# Patient Record
Sex: Male | Born: 1955
Health system: Southern US, Community
[De-identification: ages and names within clinical notes are randomized; demographics above are authoritative.]

## PROBLEM LIST (undated history)

## (undated) DIAGNOSIS — D369 Benign neoplasm, unspecified site: Secondary | ICD-10-CM

## (undated) DIAGNOSIS — E119 Type 2 diabetes mellitus without complications: Secondary | ICD-10-CM

## (undated) DIAGNOSIS — K5792 Diverticulitis of intestine, part unspecified, without perforation or abscess without bleeding: Secondary | ICD-10-CM

## (undated) DIAGNOSIS — M1990 Unspecified osteoarthritis, unspecified site: Secondary | ICD-10-CM

## (undated) DIAGNOSIS — K219 Gastro-esophageal reflux disease without esophagitis: Secondary | ICD-10-CM

## (undated) DIAGNOSIS — R7989 Other specified abnormal findings of blood chemistry: Secondary | ICD-10-CM

## (undated) DIAGNOSIS — Z9989 Dependence on other enabling machines and devices: Secondary | ICD-10-CM

## (undated) DIAGNOSIS — E785 Hyperlipidemia, unspecified: Secondary | ICD-10-CM

## (undated) DIAGNOSIS — I10 Essential (primary) hypertension: Secondary | ICD-10-CM

## (undated) DIAGNOSIS — G4733 Obstructive sleep apnea (adult) (pediatric): Secondary | ICD-10-CM

## (undated) HISTORY — PX: VASECTOMY: SHX75

## (undated) HISTORY — PX: HERNIA REPAIR: SHX51

## (undated) HISTORY — PX: COLONOSCOPY: SHX174

---

## 2007-01-21 ENCOUNTER — Ambulatory Visit: Payer: Self-pay | Admitting: Internal Medicine

## 2008-04-24 ENCOUNTER — Ambulatory Visit: Payer: Self-pay | Admitting: Gastroenterology

## 2008-06-22 ENCOUNTER — Ambulatory Visit: Payer: Self-pay | Admitting: Family Medicine

## 2012-06-03 ENCOUNTER — Ambulatory Visit: Payer: Self-pay | Admitting: Otolaryngology

## 2013-12-02 HISTORY — PX: UMBILICAL HERNIA REPAIR: SUR1181

## 2014-02-06 ENCOUNTER — Other Ambulatory Visit: Payer: Self-pay | Admitting: Urology

## 2014-02-11 IMAGING — RF DG BARIUM SWALLOW
7 of 8 series · 14 of 24 positions shown · non-contrast
Comparison: none

REASON FOR EXAM: dysphagia
COMMENTS:

PROCEDURE:     FL  - FL BARIUM SWALLOW  - June 03, 2012 [DATE]
RESULT:     Comparison: None
INDICATION: Dysphagia
TECHNIQUE: Multiple single and double-contrast images of the esophagus were
obtained under fluoroscopic guidance. Total fluoroscopy time was 1.8 minutes.

[Series 1: fluoro_barium 2fps_bw · 0.17mm/px · 2 of 26 frames shown (1 of 7)]
[frame 4/26]
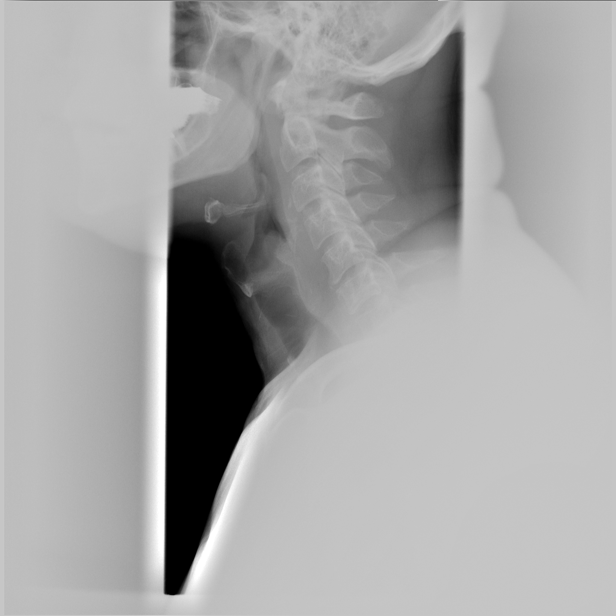
[frame 14/26]
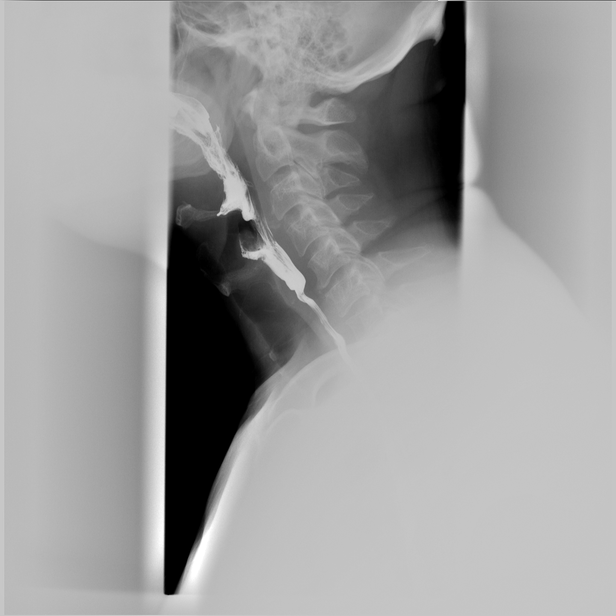

[Series 3: fluoro_barium 2fps_bw · 0.17mm/px · 3 of 30 frames shown (2 of 7)]
[frame 5/30]
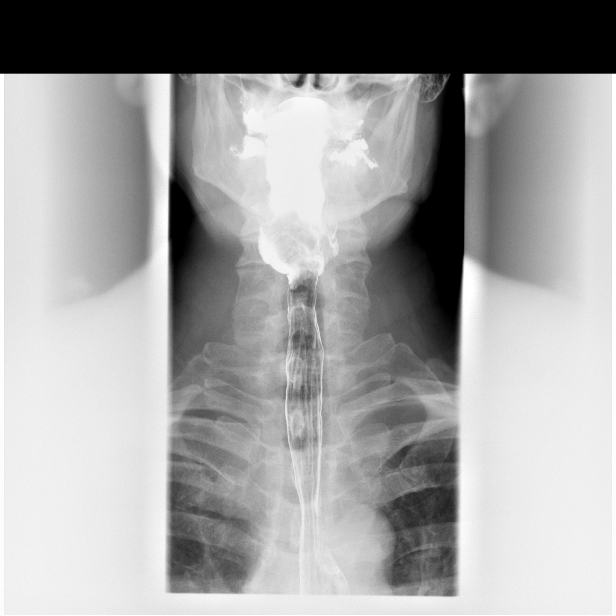
[frame 16/30]
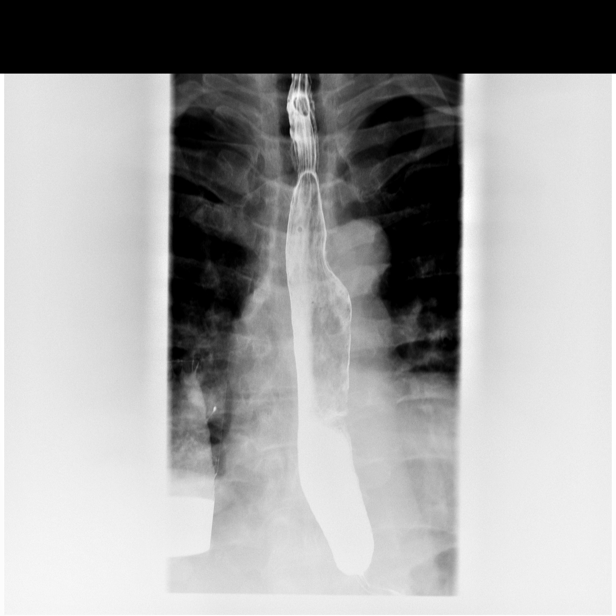
[frame 26/30]
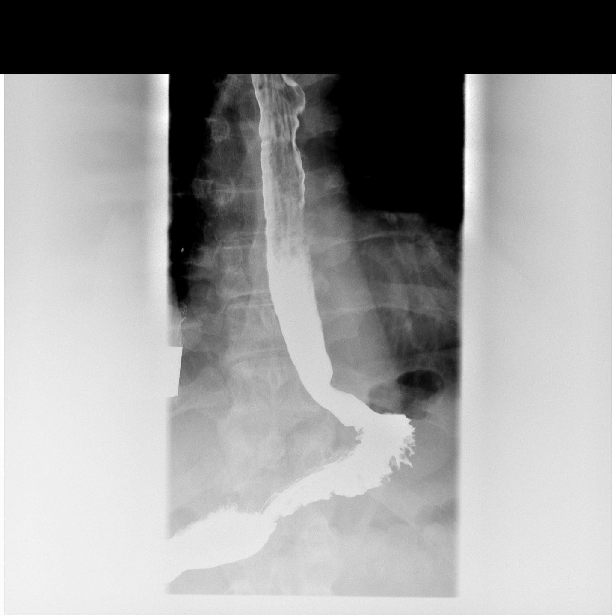

[Series 4: fluoro_barium 2fps_bw · 0.17mm/px · 1 of 27 frames shown (3 of 7)]
[frame 14/27]
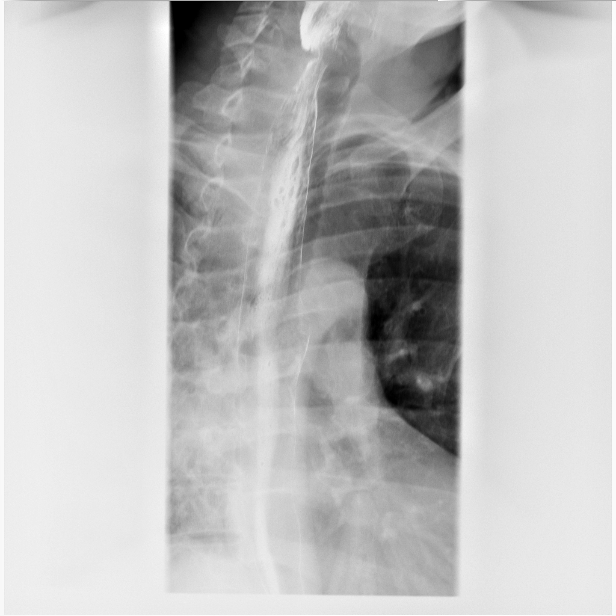

[Series 5: fluoro_barium 2fps_bw · 0.17mm/px · 3 of 40 frames shown (4 of 7)]
[frame 7/40]
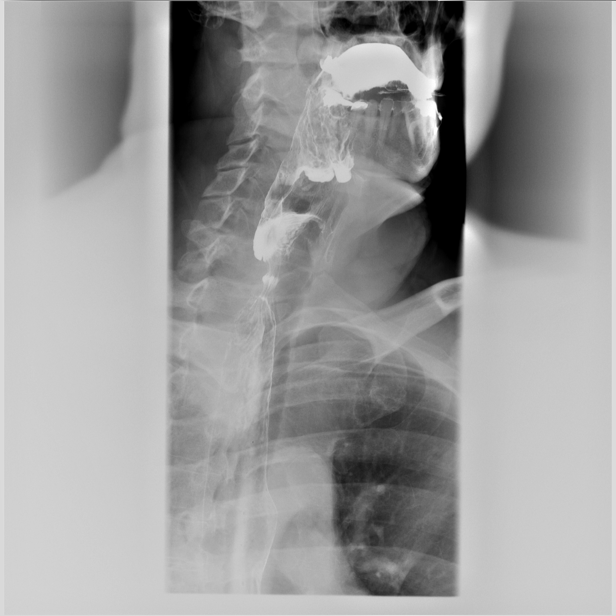
[frame 11/40]
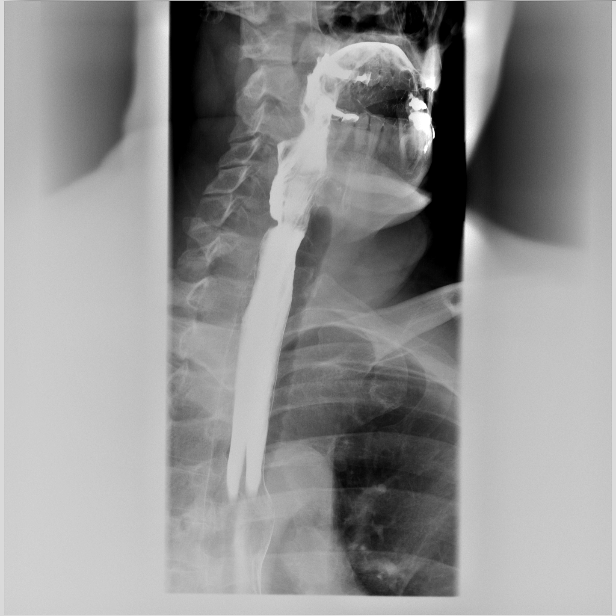
[frame 35/40]
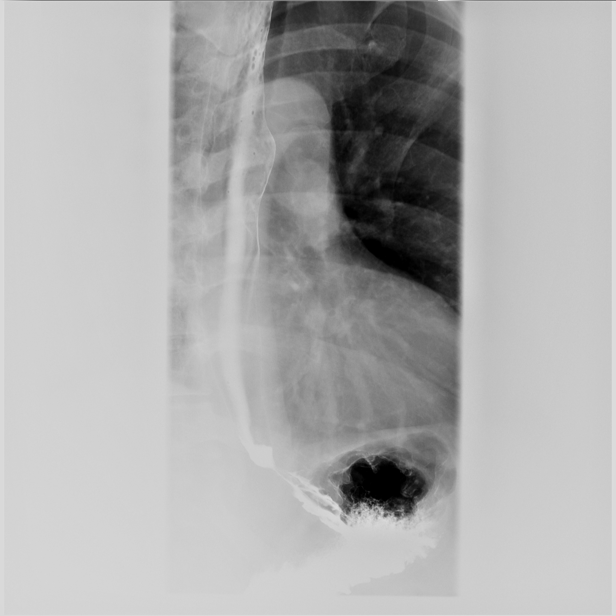

[Series 6: fluoro_barium 2fps_bw · 0.17mm/px · 1 of 40 frames shown (5 of 7)]
[frame 23/40]
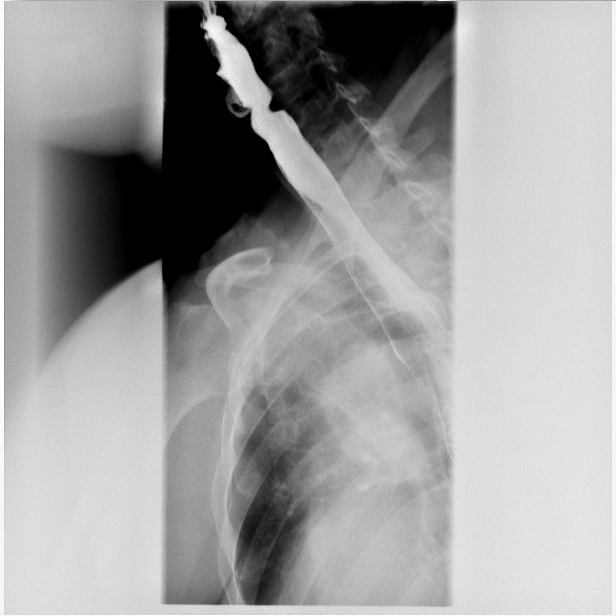

[Series 7: fluoro_barium 2fps_bw · 0.18mm/px · 2 of 40 frames shown (6 of 7)]
[frame 7/40]
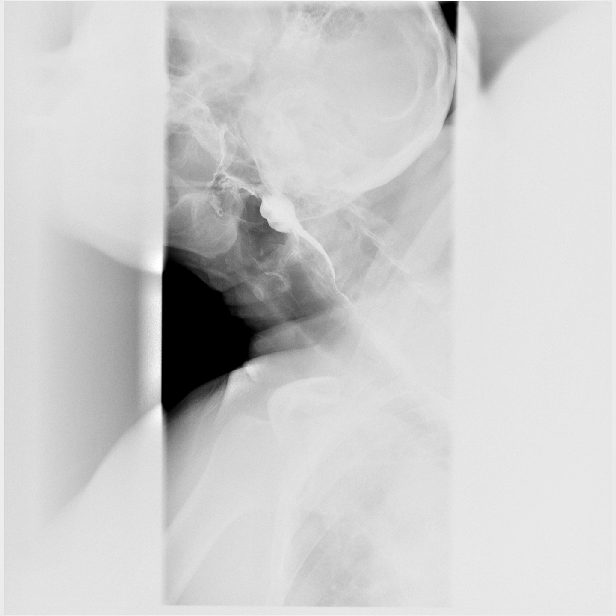
[frame 21/40]
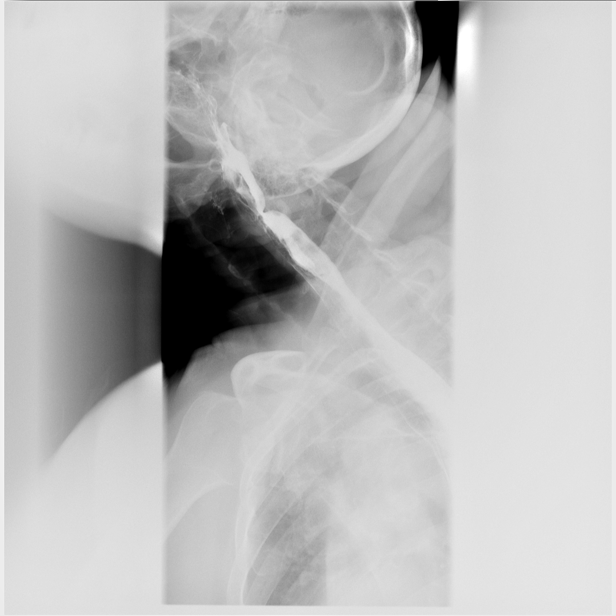

[Series 8: fluoro_barium 2fps_bw · 0.18mm/px · 2 of 18 frames shown (7 of 7)]
[frame 3/18]
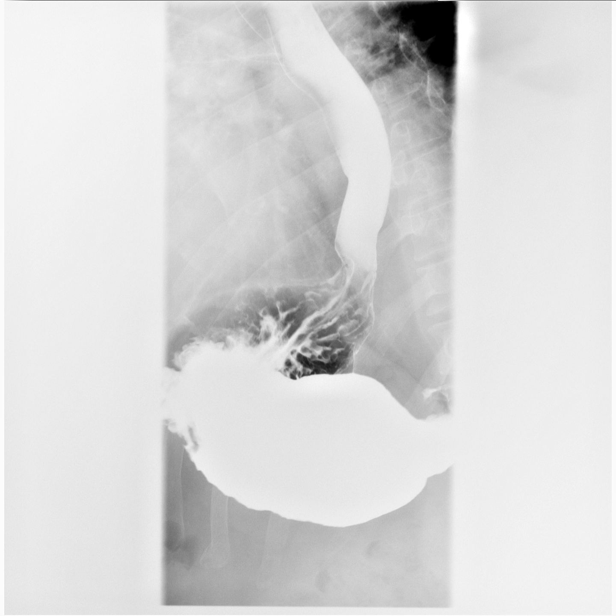
[frame 16/18]
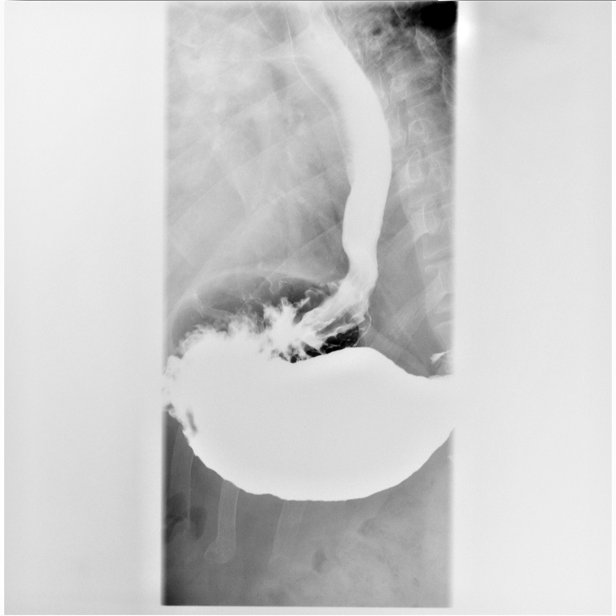

[14 of 24 positions shown; findings below may reference images not displayed]

FINDINGS: There was normal pharyngeal anatomy and motility. There is
prominent cricopharyngeus impression upon the esophagus. Contrast flowed
freely through the esophagus without evidence of stricture or mass. There
was normal esophageal mucosa without evidence of irregularity or ulceration.
 There is gastroesophageal reflux. Esophageal motility is normal. No
definite hiatal hernia was demonstrated.

At the end of the examination a 12.5mm barium tablet was administered which
transited through the esophagus and esophagogastric junction without delay.
IMPRESSION: 1. Gastroesophageal reflux. Otherwise unremarkable barium swallow.

[REDACTED]

## 2014-03-03 ENCOUNTER — Encounter (HOSPITAL_BASED_OUTPATIENT_CLINIC_OR_DEPARTMENT_OTHER): Payer: Self-pay | Admitting: *Deleted

## 2014-03-03 NOTE — Progress Notes (Signed)
NPO AFTER MN. ARRIVE AT 0715. NEEDS ISTAT AND EKG. WILL TAKE AM MEDS W/ SIPS OF WATER.

## 2014-03-09 NOTE — Anesthesia Preprocedure Evaluation (Addendum)
Anesthesia Evaluation  Patient identified by MRN, date of birth, ID band Patient awake    Reviewed: Allergy & Precautions, H&P , NPO status , Patient's Chart, lab work & pertinent test results  Airway Mallampati: II TM Distance: >3 FB Neck ROM: Full    Dental no notable dental hx.    Pulmonary neg pulmonary ROS, sleep apnea and Continuous Positive Airway Pressure Ventilation ,  breath sounds clear to auscultation  Pulmonary exam normal       Cardiovascular hypertension, negative cardio ROS  Rhythm:Regular Rate:Normal     Neuro/Psych negative neurological ROS  negative psych ROS   GI/Hepatic negative GI ROS, Neg liver ROS, GERD-  Medicated and Controlled,  Endo/Other  negative endocrine ROS  Renal/GU negative Renal ROS  negative genitourinary   Musculoskeletal negative musculoskeletal ROS (+)   Abdominal   Peds negative pediatric ROS (+)  Hematology negative hematology ROS (+)   Anesthesia Other Findings   Reproductive/Obstetrics negative OB ROS                         Anesthesia Physical Anesthesia Plan  ASA: II  Anesthesia Plan: General   Post-op Pain Management:    Induction: Intravenous  Airway Management Planned: LMA  Additional Equipment:   Intra-op Plan:   Post-operative Plan:   Informed Consent: I have reviewed the patients History and Physical, chart, labs and discussed the procedure including the risks, benefits and alternatives for the proposed anesthesia with the patient or authorized representative who has indicated his/her understanding and acceptance.   Dental advisory given  Plan Discussed with:   Anesthesia Plan Comments:       Anesthesia Quick Evaluation

## 2014-03-10 ENCOUNTER — Ambulatory Visit (HOSPITAL_BASED_OUTPATIENT_CLINIC_OR_DEPARTMENT_OTHER): Payer: Managed Care, Other (non HMO) | Admitting: Anesthesiology

## 2014-03-10 ENCOUNTER — Encounter (HOSPITAL_BASED_OUTPATIENT_CLINIC_OR_DEPARTMENT_OTHER): Payer: Self-pay | Admitting: Urology

## 2014-03-10 ENCOUNTER — Ambulatory Visit (HOSPITAL_BASED_OUTPATIENT_CLINIC_OR_DEPARTMENT_OTHER)
Admission: RE | Admit: 2014-03-10 | Discharge: 2014-03-10 | Disposition: A | Discharge status: Home / Self Care | Payer: Managed Care, Other (non HMO) | Source: Ambulatory Visit | Attending: Urology | Admitting: Urology

## 2014-03-10 ENCOUNTER — Encounter (HOSPITAL_BASED_OUTPATIENT_CLINIC_OR_DEPARTMENT_OTHER)
Admission: RE | Disposition: A | Discharge status: Home / Self Care | Payer: Managed Care, Other (non HMO) | Source: Ambulatory Visit | Attending: Urology

## 2014-03-10 ENCOUNTER — Encounter (HOSPITAL_BASED_OUTPATIENT_CLINIC_OR_DEPARTMENT_OTHER): Payer: Managed Care, Other (non HMO) | Admitting: Anesthesiology

## 2014-03-10 DIAGNOSIS — L940 Localized scleroderma [morphea]: Secondary | ICD-10-CM | POA: Diagnosis not present

## 2014-03-10 DIAGNOSIS — K219 Gastro-esophageal reflux disease without esophagitis: Secondary | ICD-10-CM | POA: Diagnosis not present

## 2014-03-10 DIAGNOSIS — G4730 Sleep apnea, unspecified: Secondary | ICD-10-CM | POA: Diagnosis not present

## 2014-03-10 DIAGNOSIS — I10 Essential (primary) hypertension: Secondary | ICD-10-CM | POA: Diagnosis not present

## 2014-03-10 DIAGNOSIS — N471 Phimosis: Secondary | ICD-10-CM | POA: Insufficient documentation

## 2014-03-10 DIAGNOSIS — N478 Other disorders of prepuce: Principal | ICD-10-CM | POA: Insufficient documentation

## 2014-03-10 HISTORY — DX: Obstructive sleep apnea (adult) (pediatric): G47.33

## 2014-03-10 HISTORY — PX: CIRCUMCISION: SHX1350

## 2014-03-10 HISTORY — DX: Essential (primary) hypertension: I10

## 2014-03-10 HISTORY — DX: Gastro-esophageal reflux disease without esophagitis: K21.9

## 2014-03-10 HISTORY — DX: Dependence on other enabling machines and devices: Z99.89

## 2014-03-10 LAB — POCT I-STAT 4, (NA,K, GLUC, HGB,HCT)
Glucose, Bld: 120 mg/dL — ABNORMAL HIGH (ref 70–99)
Glucose, Bld: 120 mg/dL — ABNORMAL HIGH (ref 70–99)
HCT: 43 % (ref 39.0–52.0)
HCT: 44 % (ref 39.0–52.0)
HEMOGLOBIN: 14.6 g/dL (ref 13.0–17.0)
Hemoglobin: 15 g/dL (ref 13.0–17.0)
Potassium: 5.6 mEq/L — ABNORMAL HIGH (ref 3.7–5.3)
Potassium: 6.1 mEq/L — ABNORMAL HIGH (ref 3.7–5.3)
SODIUM: 139 meq/L (ref 137–147)
Sodium: 138 mEq/L (ref 137–147)

## 2014-03-10 SURGERY — CIRCUMCISION, ADULT
Anesthesia: General | Site: Penis

## 2014-03-10 MED ORDER — CEFAZOLIN SODIUM-DEXTROSE 2-3 GM-% IV SOLR
INTRAVENOUS | Status: AC
  Filled 2014-03-10: qty 50

## 2014-03-10 MED ORDER — OXYCODONE HCL 5 MG PO TABS
5.0000 mg | ORAL_TABLET | ORAL | Status: DC | PRN
  Administered 2014-03-10: 5 mg via ORAL
  Filled 2014-03-10: qty 1

## 2014-03-10 MED ORDER — PROMETHAZINE HCL 25 MG/ML IJ SOLN
6.2500 mg | INTRAMUSCULAR | Status: DC | PRN
  Filled 2014-03-10: qty 1

## 2014-03-10 MED ORDER — FENTANYL CITRATE 0.05 MG/ML IJ SOLN
INTRAMUSCULAR | Status: AC
  Filled 2014-03-10: qty 4

## 2014-03-10 MED ORDER — ACETAMINOPHEN 10 MG/ML IV SOLN
INTRAVENOUS | Status: DC | PRN
  Administered 2014-03-10: 1000 mg via INTRAVENOUS

## 2014-03-10 MED ORDER — MEPERIDINE HCL 25 MG/ML IJ SOLN
6.2500 mg | INTRAMUSCULAR | Status: DC | PRN
  Filled 2014-03-10: qty 1

## 2014-03-10 MED ORDER — LACTATED RINGERS IV SOLN
INTRAVENOUS | Status: DC
  Administered 2014-03-10: 08:00:00 via INTRAVENOUS
  Filled 2014-03-10: qty 1000

## 2014-03-10 MED ORDER — PROPOFOL 10 MG/ML IV BOLUS
INTRAVENOUS | Status: DC | PRN
  Administered 2014-03-10: 200 mg via INTRAVENOUS

## 2014-03-10 MED ORDER — DEXAMETHASONE SODIUM PHOSPHATE 4 MG/ML IJ SOLN
INTRAMUSCULAR | Status: DC | PRN
  Administered 2014-03-10: 10 mg via INTRAVENOUS

## 2014-03-10 MED ORDER — OXYCODONE HCL 5 MG PO TABS
ORAL_TABLET | ORAL | Status: AC
  Filled 2014-03-10: qty 1

## 2014-03-10 MED ORDER — KETOROLAC TROMETHAMINE 30 MG/ML IJ SOLN
INTRAMUSCULAR | Status: DC | PRN
  Administered 2014-03-10: 30 mg via INTRAVENOUS

## 2014-03-10 MED ORDER — FENTANYL CITRATE 0.05 MG/ML IJ SOLN
25.0000 ug | INTRAMUSCULAR | Status: DC | PRN
  Filled 2014-03-10: qty 1

## 2014-03-10 MED ORDER — CEFAZOLIN SODIUM-DEXTROSE 2-3 GM-% IV SOLR
2.0000 g | INTRAVENOUS | Status: AC
  Administered 2014-03-10: 2 g via INTRAVENOUS
  Filled 2014-03-10: qty 50

## 2014-03-10 MED ORDER — LIDOCAINE HCL (CARDIAC) 20 MG/ML IV SOLN
INTRAVENOUS | Status: DC | PRN
  Administered 2014-03-10: 100 mg via INTRAVENOUS

## 2014-03-10 MED ORDER — SODIUM CHLORIDE 0.9 % IR SOLN
Status: DC | PRN
  Administered 2014-03-10: 500 mL

## 2014-03-10 MED ORDER — OXYCODONE HCL 5 MG PO TABS: 5.0000 mg | ORAL_TABLET | ORAL | Status: DC | PRN

## 2014-03-10 MED ORDER — MIDAZOLAM HCL 5 MG/5ML IJ SOLN
INTRAMUSCULAR | Status: DC | PRN
  Administered 2014-03-10: 2 mg via INTRAVENOUS

## 2014-03-10 MED ORDER — MIDAZOLAM HCL 2 MG/2ML IJ SOLN
INTRAMUSCULAR | Status: AC
  Filled 2014-03-10: qty 2

## 2014-03-10 MED ORDER — EPHEDRINE SULFATE 50 MG/ML IJ SOLN
INTRAMUSCULAR | Status: DC | PRN
  Administered 2014-03-10 (×3): 10 mg via INTRAVENOUS

## 2014-03-10 MED ORDER — CEFAZOLIN SODIUM 1-5 GM-% IV SOLN
1.0000 g | INTRAVENOUS | Status: DC
  Filled 2014-03-10: qty 50

## 2014-03-10 MED ORDER — DOCUSATE SODIUM 100 MG PO CAPS: 100.0000 mg | ORAL_CAPSULE | Freq: Two times a day (BID) | ORAL | Status: DC | PRN

## 2014-03-10 MED ORDER — ONDANSETRON HCL 4 MG/2ML IJ SOLN
INTRAMUSCULAR | Status: DC | PRN
  Administered 2014-03-10: 4 mg via INTRAVENOUS

## 2014-03-10 MED ORDER — FENTANYL CITRATE 0.05 MG/ML IJ SOLN
INTRAMUSCULAR | Status: DC | PRN
  Administered 2014-03-10 (×2): 50 ug via INTRAVENOUS

## 2014-03-10 SURGICAL SUPPLY — 37 items
BANDAGE CO FLEX L/F 2IN X 5YD (GAUZE/BANDAGES/DRESSINGS) ×2 IMPLANT
BANDAGE COBAN STERILE 2 (GAUZE/BANDAGES/DRESSINGS) ×2 IMPLANT
BLADE SURG 15 STRL LF DISP TIS (BLADE) ×1 IMPLANT
BLADE SURG 15 STRL SS (BLADE) ×1
BNDG CONFORM 2 STRL LF (GAUZE/BANDAGES/DRESSINGS) ×2 IMPLANT
CLOTH BEACON ORANGE TIMEOUT ST (SAFETY) ×2 IMPLANT
COVER MAYO STAND STRL (DRAPES) ×2 IMPLANT
COVER TABLE BACK 60X90 (DRAPES) ×2 IMPLANT
DECANTER SPIKE VIAL GLASS SM (MISCELLANEOUS) IMPLANT
DRAPE LAPAROTOMY T 102X78X121 (DRAPES) IMPLANT
DRAPE PED LAPAROTOMY (DRAPES) ×2 IMPLANT
ELECT NEEDLE TIP 2.8 STRL (NEEDLE) ×2 IMPLANT
ELECT REM PT RETURN 9FT ADLT (ELECTROSURGICAL) ×2
ELECTRODE REM PT RTRN 9FT ADLT (ELECTROSURGICAL) ×1 IMPLANT
GAUZE SPONGE 4X4 16PLY XRAY LF (GAUZE/BANDAGES/DRESSINGS) ×2 IMPLANT
GAUZE XEROFORM 1X8 LF (GAUZE/BANDAGES/DRESSINGS) ×2 IMPLANT
GLOVE BIO SURGEON STRL SZ7.5 (GLOVE) ×4 IMPLANT
GOWN STRL REIN XL XLG (GOWN DISPOSABLE) IMPLANT
GOWN STRL REUS W/ TWL LRG LVL3 (GOWN DISPOSABLE) ×2 IMPLANT
GOWN STRL REUS W/ TWL XL LVL3 (GOWN DISPOSABLE) ×1 IMPLANT
GOWN STRL REUS W/TWL LRG LVL3 (GOWN DISPOSABLE) ×2
GOWN STRL REUS W/TWL XL LVL3 (GOWN DISPOSABLE) ×1
NEEDLE HYPO 25X1 1.5 SAFETY (NEEDLE) ×2 IMPLANT
NS IRRIG 500ML POUR BTL (IV SOLUTION) ×2 IMPLANT
PACK BASIN DAY SURGERY FS (CUSTOM PROCEDURE TRAY) ×2 IMPLANT
PENCIL BUTTON HOLSTER BLD 10FT (ELECTRODE) ×2 IMPLANT
SPONGE GAUZE 4X4 12PLY STER LF (GAUZE/BANDAGES/DRESSINGS) ×2 IMPLANT
SUT VIC AB 3-0 SH 27 (SUTURE) ×2
SUT VIC AB 3-0 SH 27X BRD (SUTURE) ×2 IMPLANT
SUT VIC AB 4-0 RB1 27 (SUTURE) ×2
SUT VIC AB 4-0 RB1 27X BRD (SUTURE) ×2 IMPLANT
SUT VIC AB 4-0 SH 27 (SUTURE)
SUT VIC AB 4-0 SH 27XBRD (SUTURE) IMPLANT
SYR CONTROL 10ML LL (SYRINGE) ×2 IMPLANT
TOWEL OR 17X26 10 PK STRL BLUE (TOWEL DISPOSABLE) ×2 IMPLANT
TRAY DSU PREP LF (CUSTOM PROCEDURE TRAY) ×2 IMPLANT
WATER STERILE IRR 500ML POUR (IV SOLUTION) IMPLANT

## 2014-03-10 NOTE — Interval H&P Note (Signed)
History and Physical Interval Note:  03/10/2014 8:45 AM  Micheal Johnson.  has presented today for surgery, with the diagnosis of phimosis  The various methods of treatment have been discussed with the patient and family. After consideration of risks, benefits and other options for treatment, the patient has consented to  Procedure(s): CIRCUMCISION ADULT (N/A) as a surgical intervention .  The patient's history has been reviewed, patient examined, no change in status, stable for surgery.  I have reviewed the patient's chart and labs.  Questions were answered to the patient's satisfaction.     Louis Meckel W

## 2014-03-10 NOTE — Discharge Instructions (Signed)
Postoperative instructions for circumcision  Wound:  In most cases your incision will have absorbable sutures that run along the course of your incision and will dissolve within the first 10-20 days. Some will fall out even earlier. Expect some redness as the sutures dissolved but this should occur only around the sutures. If there is generalized redness, especially with increasing pain or swelling, let us know. The penis will very likely get "black and blue" as the blood in the tissues spread. Sometimes the whole penis will turn colors. The black and blue is followed by a yellow and brown color. In time, all the discoloration will go away.  Diet:  You may return to your normal diet within 24 hours following your surgery. You may note some mild nausea and possibly vomiting the first 6-8 hours following surgery. This is usually due to the side effects of anesthesia, and will disappear quite soon. I would suggest clear liquids and a very light meal the first evening following your surgery.  Activity:  Your physical activity should be restricted the first 48 hours. During that time you should remain relatively inactive, moving about only when necessary. During the first 7 days following surgery he should avoid lifting any heavy objects (anything greater than 15 pounds), and avoid strenuous exercise. If you work, ask [place] specifically about your restrictions, both for work and home. We will write a note to your employer if needed.  Ice packs can be placed on and off over the penis for the first 48 hours to help relieve the pain and keep the swelling down. Frozen peas or corn in a ZipLock bag can be frozen, used and re-frozen. Fifteen minutes on and 15 minutes off is a reasonable schedule.   Hygiene:  You may shower 48 hours after your surgery. Tub bathing should be restricted until the seventh day.  Medication:  You will be sent home with some type of pain medication. In many cases you will be sent  home with a narcotic pain pill (oxycodone). If the pain is not too bad, you may take either Tylenol (acetaminophen) or Advil (ibuprofen) which contain no narcotic agents, and might be tolerated a little better, with fewer side effects. If the pain medication you are sent home with does not control the pain, you will have to let us know. Some narcotic pain medications cannot be given or refilled by a phone call to a pharmacy.  Problems you should report to [place]:   Fever of 101.0 degrees Fahrenheit or greater.  Moderate or severe swelling under the skin incision or involving the scrotum.  Drug reaction such as hives, a rash, nausea or vomiting.    Post Anesthesia Home Care Instructions  Activity: Get plenty of rest for the remainder of the day. A responsible adult should stay with you for 24 hours following the procedure.  For the next 24 hours, DO NOT: -Drive a car -Paediatric nurse -Drink alcoholic beverages -Take any medication unless instructed by your physician -Make any legal decisions or sign important papers.  Meals: Start with liquid foods such as gelatin or soup. Progress to regular foods as tolerated. Avoid greasy, spicy, heavy foods. If nausea and/or vomiting occur, drink only clear liquids until the nausea and/or vomiting subsides. Call your physician if vomiting continues.  Special Instructions/Symptoms: Your throat may feel dry or sore from the anesthesia or the breathing tube placed in your throat during surgery. If this causes discomfort, gargle with warm salt water. The discomfort should disappear  within 24 hours.

## 2014-03-10 NOTE — Anesthesia Postprocedure Evaluation (Signed)
  Anesthesia Post-op Note  Patient: Micheal Johnson.  Procedure(s) Performed: Procedure(s) (LRB): CIRCUMCISION ADULT (N/A)  Patient Location: PACU  Anesthesia Type: General  Level of Consciousness: awake and alert   Airway and Oxygen Therapy: Patient Spontanous Breathing  Post-op Pain: mild  Post-op Assessment: Post-op Vital signs reviewed, Patient's Cardiovascular Status Stable, Respiratory Function Stable, Patent Airway and No signs of Nausea or vomiting  Last Vitals:  Filed Vitals:   03/10/14 1000  BP: 133/87  Pulse: 102  Temp:   Resp: 12    Post-op Vital Signs: stable   Complications: No apparent anesthesia complications

## 2014-03-10 NOTE — Progress Notes (Signed)
Dressing around penis came off.  Patient had been told that he would need to keep it on for several days.  Dr.Herrick was called to check surgery site.  He redressed the area with bacitracin, gauze and coban. Patient was informed that if it falls off again just leave it off.

## 2014-03-10 NOTE — H&P (Signed)
Reason For Visit Phimosis   History of Present Illness 58 year old male referred by Dr. Emily Filbert for a tight band around his foreskin.  The patient's symptoms began approximately 3 weeks ago. He noted a tight and around his foreskin. The patient has been able to retract his foreskin without difficulty, although it is tight. He does not prohibit him from having intercourse. The patient also has noted some itching and burning in the area of the band. He has tried over-the-counter steroid cream which seems to have improved it somewhat. The patient denies a history of recurrent urinary tract infections. He was circumcised as a child although has had redundant foreskin. He retract his foreskin daily. He denies any voiding symptoms. He denies any skin lesions or ulcerations.  12/23/13: Treated wit h 6 wks of steroid cream. The patient has been diligent about applying cream to the affected area at least twice a day. He relates that the [place] has loosened slightly but the discoloration or likened sclerosis area seems to be widening. The patient denies any trouble with his erections.     Interval: The patient has now been on steroid cream for 12 weeks and has seen little benefit. He continues to have a tight band of phimotic foreskin which creates pain with erections. He denies any voiding symptoms. He denies any ulcerations or penile lesions.   Past Medical History Problems  1. History of arthritis (V13.4) 2. History of esophageal reflux (V12.79) 3. History of hypertension (V12.59) 4. History of sleep apnea (V13.89)  Surgical History Problems  1. History of Surgery Of Male Genitalia Vasectomy  Current Meds 1. Clotrimazole-Betamethasone 1-0.05 % External Cream; APPLY THIN FILM TO  AFFECTED AREA(S) 3 TIMES DAILY;  Therapy: [id number] to (Last IO:[id number]) Ordered 2. Juice Plus Fibre Oral Liquid;  Therapy: (Recorded:08May2015) to Recorded 3. Losartan Potassium 100 MG Oral Tablet;  Therapy: 14Apr2015 to Recorded 4. Omeprazole 20 MG Oral Capsule Delayed Release;  Therapy: [id number] to Recorded  Allergies Medication  1. No Known Drug Allergies  Family History Problems  1. No pertinent family history : Mother  Social History Problems  1. Alcohol use (V49.89) 2. Caffeine use (V49.89) 3. Married 4. Never a smoker 5. 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 Three children  Review of Systems No changes in pts bowel habits, neurological changes, or progressive lower urinary tract symptoms.    Vitals Vital Signs [Data Includes: Last 1 Day]  Recorded: 04Aug2015 03:31PM  Blood Pressure: 151 / 99 Temperature: 97.6 F Heart Rate: 74  Physical Exam Genitourinary: The patient has lichen sclerosus circumferentially on his foreskin and the corona of his glans. He is able to retract his foreskin quite easily. There are no ulcerations or lesions.    Results/Data Urine [Data Includes: Last 1 Day]   [id number]  COLOR YELLOW   APPEARANCE CLEAR   SPECIFIC GRAVITY 1.015   pH 7.0   GLUCOSE NEG mg/dL  BILIRUBIN NEG   KETONE NEG mg/dL  BLOOD NEG   PROTEIN NEG mg/dL  UROBILINOGEN 1 mg/dL  NITRITE NEG   LEUKOCYTE ESTERASE NEG    Patient's urinalysis today reveals no evidence of infection, inflammation, or hematuria.   Assessment Assessed  1. Lichen sclerosus (119.4) 2. Phimosis (605)  Plan Health Maintenance  1. UA With REFLEX; [Do Not Release]; Status:Complete;   Done: [id number] 03:21PM Phimosis  2. Follow-up Schedule Surgery Office  Follow-up  Status: Complete  Done: [id number]  Discussion/Summary The patient has effectively failed conservative/medical therapy. As such, I recommended  the patient undergo a circumcision. We discussed the surgery in detail as well as the recovery. I detailed the risks and benefits of the operation. I answered all the patient's questions in regards to the operation. Ultimately, the patient would like to proceed. We'll get this scheduled  at the patient's convenience.

## 2014-03-10 NOTE — Anesthesia Procedure Notes (Signed)
Procedure Name: LMA Insertion Date/Time: 03/10/2014 8:55 AM Performed by: Mechele Claude Pre-anesthesia Checklist: Patient identified, Emergency Drugs available, Suction available and Patient being monitored Patient Re-evaluated:Patient Re-evaluated prior to inductionOxygen Delivery Method: Circle System Utilized Preoxygenation: Pre-oxygenation with 100% oxygen Intubation Type: IV induction Ventilation: Mask ventilation without difficulty LMA: LMA inserted LMA Size: 4.0 Number of attempts: 1 Airway Equipment and Method: bite block Placement Confirmation: positive ETCO2 Tube secured with: Tape Dental Injury: Teeth and Oropharynx as per pre-operative assessment

## 2014-03-10 NOTE — Op Note (Signed)
Preoperative diagnosis:  1. phimosis   Postoperative diagnosis:  1. same   Procedure: 1. cirucmcision  Surgeon: Ardis Hughs, MD  Anesthesia: General  Complications: None  Intraoperative findings: small scrotal web at the end, penile skin was removed in order to get phimotic ring and involved lichen sclerosis.  EBL: Minimal  Specimens: foreskin  Indication: Micheal Johnson. is a 58 y.o. patient with phimosis secondary to lichen sclerosis who has failed 12 weeks of medical therapy.  After reviewing the management options for treatment, he elected to proceed with the above surgical procedure(s). We have discussed the potential benefits and risks of the procedure, side effects of the proposed treatment, the likelihood of the patient achieving the goals of the procedure, and any potential problems that might occur during the procedure or recuperation. Informed consent has been obtained.  Description of procedure:  After general anesthesia was induced the patient was prepped and draped in the routine sterile fashion. A penile block was then performed with local anesthetic. The foreskin was then opened enough to allow retraction over the glans penis. The prepuce was then marked so as to provide a proximately 5 mm collar.  The foreskin was then reduced over the glans penis and marked so as to allow a nice fit for the reanastomosis of the skin to the collar.  A 15 blade was then used to make a circumferential incision through the dermis of both marks. A pair of Metzenbaum scissors was then tunneled through the incisions and the foreskin opened on top of the scissors using electrocautery. The foreskin was then removed circumferentially. The underlying tissue was then cauterized and all bleeding stopped.  The penile skin was then approximated to the collar in 4 quadrants using a 3-0 Vicryl. The frenulum was attached with a U stitch in the dorsal surface. The remaining skin was then closed  in a running baseball stitch in 4 separate 4-0 vicryl sutures.  Vaseline impregnated dressing was then applied to the suture line in the penis was wrapped with 2 inch cling dressing gently. The patient was then given a fluff dressing and a pair of mesh underpants. The patient was subsequently awoken and returned to PACU in excellent condition. There no immediate complications.  Ardis Hughs, M.D.

## 2014-03-10 NOTE — Transfer of Care (Signed)
Immediate Anesthesia Transfer of Care Note  Patient: Micheal Johnson.  Procedure(s) Performed: Procedure(s) (LRB): CIRCUMCISION ADULT (N/A)  Patient Location: PACU  Anesthesia Type: General  Level of Consciousness: awake, alert  and oriented  Airway & Oxygen Therapy: Patient Spontanous Breathing and Patient connected to nasal cannula oxygen  Post-op Assessment: Report given to PACU RN and Post -op Vital signs reviewed and stable  Post vital signs: Reviewed and stable  Complications: No apparent anesthesia complications

## 2014-03-13 ENCOUNTER — Encounter (HOSPITAL_BASED_OUTPATIENT_CLINIC_OR_DEPARTMENT_OTHER): Payer: Self-pay | Admitting: Urology

## 2014-08-14 ENCOUNTER — Ambulatory Visit: Payer: Self-pay | Admitting: Registered Nurse

## 2016-09-18 DIAGNOSIS — H02834 Dermatochalasis of left upper eyelid: Secondary | ICD-10-CM | POA: Diagnosis not present

## 2016-09-18 DIAGNOSIS — H02831 Dermatochalasis of right upper eyelid: Secondary | ICD-10-CM | POA: Diagnosis not present

## 2016-10-24 NOTE — Discharge Instructions (Signed)
INSTRUCTIONS FOLLOWING OCULOPLASTIC SURGERY °AMY M. FOWLER, MD ° °AFTER YOUR EYE SURGERY, THER ARE MANY THINGS THWIHC YOU, THE PATIENT, CAN DO TO ASSURE THE BEST POSSIBLE RESULT FROM YOUR OPERATION.  THIS SHEET SHOULD BE REFERRED TO WHENEVER QUESTIONS ARISE.  IF THERE ARE ANY QUESTIONS NOT ANSWERED HERE, DO NOT HESITATE TO CALL OUR OFFICE AT [phone] OR [phone].  THERE IS ALWAYS [facility] AVAILABLE TO CALL IF QUESTIONS OR PROBLEMS ARISE. ° °VISION: Your vision may be blurred and out of focus after surgery until you are able to stop using your ointment, swelling resolves and your eye(s) heal. This may take 1 to 2 weeks at the least.  If your vision becomes gradually more dim or dark, this is not normal and you need to call our office immediately. ° °EYE CARE: For the first 48 hours after surgery, use ice packs frequently - “20 minutes on, 20 minutes off” - to help reduce swelling and bruising.  Small bags of frozen peas or corn make good ice packs along with cloths soaked in ice water.  If you are wearing a patch or other type of dressing following surgery, keep this on for the amount of time specified by your doctor.  For the first week following surgery, you will need to treat your stitches with great care.  If is OK to shower, but take care to not allow soapy water to run into your eye(s) to help reduce changes of infection.  You may gently clean the eyelashes and around the eye(s) with cotton balls and sterile water, BUT DO NOT RUB THE STITCHES VIGOROUSLY.  Keeping your stitches moist with ointment will help promote healing with minimal scar formation. ° °ACTIVITY: When you leave the surgery center, you should go home, rest and be inactive.  The eye(s) may feel scratchy and keeping the eyes closed will allow for faster healing.  The first week following surgery, avoid straining (anything making the face turn red) or lifting over 20 pounds.  Additionally, avoid bending which causes your head to go below  your waist.  Using your eyes will NOT harm them, so feel free to read, watch television, use the computer, etc as desired.  Driving depends on each individual, so check with your doctor if you have questions about driving. ° °MEDICATIONS:  You will be given a prescription for an ointment to use 4 times a day on your stitches.  You can use the ointment in your eyes if they feel scratchy or irritated.  If you eyelid(s) don’t close completely when you sleep, put some ointment in your eyes before bedtime. ° °EMERGENCY: If you experience SEVERE EYE PAIN OR HEADACHE UNRELIEVED BY TYLENOL OR PERCOCET, NAUSEA OR VOMITING, WORSENING REDNESS, OR WORSENING VISION (ESPECIALLY VISION THAT WA INITIALLY BETTER) CALL [phone] OR [phone] DURING BUSINESS HOURS OR AFTER HOURS. ° °General Anesthesia, Adult, Care After °These instructions provide you with information about caring for yourself after your procedure. Your health care provider may also give you more specific instructions. Your treatment has been planned according to current medical practices, but problems sometimes occur. Call your health care provider if you have any problems or questions after your procedure. °What can I expect after the procedure? °After the procedure, it is common to have: °· Vomiting. °· A sore throat. °· Mental slowness. °It is common to feel: °· Nauseous. °· Cold or shivery. °· Sleepy. °· Tired. °· Sore or achy, even in parts of your body where you did not have surgery. °Follow   these instructions at home: °For at least 24 hours after the procedure:  °· Do not: °¨ Participate in activities where you could fall or become injured. °¨ Drive. °¨ Use heavy machinery. °¨ Drink alcohol. °¨ Take sleeping pills or medicines that cause drowsiness. °¨ Make important decisions or sign legal documents. °¨ Take care of children on your own. °· Rest. °Eating and drinking  °· If you vomit, drink water, juice, or soup when you can drink without  vomiting. °· Drink enough fluid to keep your urine clear or pale yellow. °· Make sure you have little or no nausea before eating solid foods. °· Follow the diet recommended by your health care provider. °General instructions  °· Have a responsible adult stay with you until you are awake and alert. °· Return to your normal activities as told by your health care provider. Ask your health care provider what activities are safe for you. °· Take over-the-counter and prescription medicines only as told by your health care provider. °· If you smoke, do not smoke without supervision. °· Keep all follow-up visits as told by your health care provider. This is important. °Contact a health care provider if: °· You continue to have nausea or vomiting at home, and medicines are not helpful. °· You cannot drink fluids or start eating again. °· You cannot urinate after 8-12 hours. °· You develop a skin rash. °· You have fever. °· You have increasing redness at the site of your procedure. °Get help right away if: °· You have difficulty breathing. °· You have chest pain. °· You have unexpected bleeding. °· You feel that you are having a life-threatening or urgent problem. °This information is not intended to replace advice given to you by your health care provider. Make sure you discuss any questions you have with your health care provider. °Document Released: 09/22/2000 Document Revised: 11/19/2015 Document Reviewed: 05/31/2015 °[employer]vier Interactive Patient Education © 2017 [employer] ° °

## 2016-10-28 ENCOUNTER — Encounter
Admission: RE | Disposition: A | Discharge status: Home / Self Care | Payer: Self-pay | Source: Ambulatory Visit | Attending: Ophthalmology

## 2016-10-28 ENCOUNTER — Ambulatory Visit: Payer: BLUE CROSS/BLUE SHIELD | Admitting: Anesthesiology

## 2016-10-28 ENCOUNTER — Ambulatory Visit
Admission: RE | Admit: 2016-10-28 | Discharge: 2016-10-28 | Disposition: A | Discharge status: Home / Self Care | Payer: BLUE CROSS/BLUE SHIELD | Source: Ambulatory Visit | Attending: Ophthalmology | Admitting: Ophthalmology

## 2016-10-28 DIAGNOSIS — Z79899 Other long term (current) drug therapy: Secondary | ICD-10-CM | POA: Insufficient documentation

## 2016-10-28 DIAGNOSIS — H02834 Dermatochalasis of left upper eyelid: Secondary | ICD-10-CM | POA: Diagnosis not present

## 2016-10-28 DIAGNOSIS — Z9989 Dependence on other enabling machines and devices: Secondary | ICD-10-CM | POA: Insufficient documentation

## 2016-10-28 DIAGNOSIS — H02839 Dermatochalasis of unspecified eye, unspecified eyelid: Secondary | ICD-10-CM | POA: Diagnosis present

## 2016-10-28 DIAGNOSIS — I10 Essential (primary) hypertension: Secondary | ICD-10-CM | POA: Diagnosis not present

## 2016-10-28 DIAGNOSIS — H02831 Dermatochalasis of right upper eyelid: Secondary | ICD-10-CM | POA: Diagnosis not present

## 2016-10-28 DIAGNOSIS — K219 Gastro-esophageal reflux disease without esophagitis: Secondary | ICD-10-CM | POA: Diagnosis not present

## 2016-10-28 DIAGNOSIS — G4730 Sleep apnea, unspecified: Secondary | ICD-10-CM | POA: Diagnosis not present

## 2016-10-28 HISTORY — DX: Unspecified osteoarthritis, unspecified site: M19.90

## 2016-10-28 HISTORY — PX: BROW LIFT: SHX178

## 2016-10-28 HISTORY — DX: Diverticulitis of intestine, part unspecified, without perforation or abscess without bleeding: K57.92

## 2016-10-28 SURGERY — BLEPHAROPLASTY
Anesthesia: Monitor Anesthesia Care | Site: Eye | Laterality: Bilateral | Wound class: Clean

## 2016-10-28 MED ORDER — OXYCODONE HCL 5 MG PO TABS: 5.0000 mg | ORAL_TABLET | Freq: Once | ORAL | Status: DC | PRN

## 2016-10-28 MED ORDER — BSS IO SOLN
INTRAOCULAR | Status: DC | PRN
  Administered 2016-10-28: 15 mL

## 2016-10-28 MED ORDER — ERYTHROMYCIN 5 MG/GM OP OINT
TOPICAL_OINTMENT | OPHTHALMIC | Status: DC | PRN
  Administered 2016-10-28: 1 via OPHTHALMIC

## 2016-10-28 MED ORDER — OXYCODONE HCL 5 MG/5ML PO SOLN: 5.0000 mg | Freq: Once | ORAL | Status: DC | PRN

## 2016-10-28 MED ORDER — OXYCODONE-ACETAMINOPHEN 5-325 MG PO TABS: 1.0000 | ORAL_TABLET | ORAL | 0 refills | Status: DC | PRN

## 2016-10-28 MED ORDER — ERYTHROMYCIN 5 MG/GM OP OINT: TOPICAL_OINTMENT | OPHTHALMIC | 3 refills | Status: DC

## 2016-10-28 MED ORDER — TETRACAINE HCL 0.5 % OP SOLN
OPHTHALMIC | Status: DC | PRN
  Administered 2016-10-28: 2 [drp] via OPHTHALMIC

## 2016-10-28 MED ORDER — PROPOFOL 500 MG/50ML IV EMUL
INTRAVENOUS | Status: DC | PRN
  Administered 2016-10-28: 75 ug/kg/min via INTRAVENOUS

## 2016-10-28 MED ORDER — LACTATED RINGERS IV SOLN
INTRAVENOUS | Status: DC
  Administered 2016-10-28: 09:00:00 via INTRAVENOUS

## 2016-10-28 MED ORDER — ACETAMINOPHEN 160 MG/5ML PO SOLN: 325.0000 mg | ORAL | Status: DC | PRN

## 2016-10-28 MED ORDER — LIDOCAINE-EPINEPHRINE 2 %-1:100000 IJ SOLN
INTRAMUSCULAR | Status: DC | PRN
  Administered 2016-10-28: 4 mL via OPHTHALMIC

## 2016-10-28 MED ORDER — ALFENTANIL 500 MCG/ML IJ INJ
INJECTION | INTRAVENOUS | Status: DC | PRN
  Administered 2016-10-28: 700 ug via INTRAVENOUS
  Administered 2016-10-28: 300 ug via INTRAVENOUS

## 2016-10-28 MED ORDER — MIDAZOLAM HCL 2 MG/2ML IJ SOLN
INTRAMUSCULAR | Status: DC | PRN
  Administered 2016-10-28: 2 mg via INTRAVENOUS

## 2016-10-28 MED ORDER — ACETAMINOPHEN 325 MG PO TABS
325.0000 mg | ORAL_TABLET | ORAL | Status: DC | PRN
  Administered 2016-10-28: 325 mg via ORAL

## 2016-10-28 MED ORDER — FENTANYL CITRATE (PF) 100 MCG/2ML IJ SOLN: 25.0000 ug | INTRAMUSCULAR | Status: DC | PRN

## 2016-10-28 SURGICAL SUPPLY — 35 items
APPLICATOR COTTON TIP WD 3 STR (MISCELLANEOUS) ×2 IMPLANT
BLADE SURG 15 STRL LF DISP TIS (BLADE) ×1 IMPLANT
BLADE SURG 15 STRL SS (BLADE) ×1
CORD BIP STRL DISP 12FT (MISCELLANEOUS) ×2 IMPLANT
DRAPE HEAD BAR (DRAPES) ×2 IMPLANT
GAUZE SPONGE 4X4 12PLY STRL (GAUZE/BANDAGES/DRESSINGS) ×2 IMPLANT
GAUZE SPONGE NON-WVN 2X2 STRL (MISCELLANEOUS) ×10 IMPLANT
GLOVE SURG LX 7.0 MICRO (GLOVE) ×2
GLOVE SURG LX STRL 7.0 MICRO (GLOVE) ×2 IMPLANT
MARKER SKIN XFINE TIP W/RULER (MISCELLANEOUS) ×2 IMPLANT
NEEDLE FILTER BLUNT 18X 1/2SAF (NEEDLE) ×1
NEEDLE FILTER BLUNT 18X1 1/2 (NEEDLE) ×1 IMPLANT
NEEDLE HYPO 30X.5 LL (NEEDLE) ×4 IMPLANT
PACK DRAPE NASAL/ENT (PACKS) ×2 IMPLANT
SOL PREP PVP 2OZ (MISCELLANEOUS) ×2
SOLUTION PREP PVP 2OZ (MISCELLANEOUS) ×1 IMPLANT
SPONGE VERSALON 2X2 STRL (MISCELLANEOUS) ×10
SUT CHROMIC 4-0 (SUTURE)
SUT CHROMIC 4-0 M2 12X2 ARM (SUTURE)
SUT CHROMIC 5 0 P 3 (SUTURE) IMPLANT
SUT ETHILON 4 0 CL P 3 (SUTURE) IMPLANT
SUT MERSILENE 4-0 S-2 (SUTURE) IMPLANT
SUT PLAIN GUT (SUTURE) ×2 IMPLANT
SUT PROLENE 5 0 P 3 (SUTURE) IMPLANT
SUT PROLENE 6 0 P 1 18 (SUTURE) IMPLANT
SUT SILK 4 0 G 3 (SUTURE) IMPLANT
SUT VIC AB 5-0 P-3 18X BRD (SUTURE) IMPLANT
SUT VIC AB 5-0 P3 18 (SUTURE)
SUT VICRYL 6-0  S14 CTD (SUTURE)
SUT VICRYL 6-0 S14 CTD (SUTURE) IMPLANT
SUT VICRYL 7 0 TG140 8 (SUTURE) IMPLANT
SUTURE CHRMC 4-0 M2 12X2 ARM (SUTURE) IMPLANT
SYR 3ML LL SCALE MARK (SYRINGE) ×2 IMPLANT
SYRINGE 10CC LL (SYRINGE) ×2 IMPLANT
WATER STERILE IRR 250ML POUR (IV SOLUTION) ×2 IMPLANT

## 2016-10-28 NOTE — H&P (Signed)
See the history and physical completed at [facility] on 10/14/16 and scanned into the chart.

## 2016-10-28 NOTE — Op Note (Signed)
Preoperative Diagnosis:  Visually significant dermatochalasis both Upper Eyelid(s)  Postoperative Diagnosis:  Same.  Procedure(s) Performed:   Upper eyelid blepharoplasty with excess skin excision  bilateral Upper Eyelid(s)  Teaching Surgeon: Philis Pique. Vickki Muff, M.D.  Assistants: none  Anesthesia: MAC  Specimens: None.  Estimated Blood Loss: Minimal.  Complications: None.  Operative Findings: None Dictated  Procedure:   Allergies were reviewed and the patient is allergic to Patient has no known allergies..   After the risks, benefits, complications and alternatives were discussed with the patient, appropriate informed consent was obtained and the patient was brought to the operating suite. The patient was reclined supine and a timeout was conducted.  The patient was then sedated.  Local anesthetic consisting of a 50-50 mixture of 2% lidocaine with epinephrine and 0.75% bupivacaine with added Hylenex was injected subcutaneously to both upper eyelid(s). After adequate local was instilled, the patient was prepped and draped in the usual sterile fashion for eyelid surgery.   Attention was turned to the upper eyelids. A 19m upper eyelid crease incision line was marked with calipers on both upper eyelid(s).  A pinch test was used to estimate the amount of excess skin to remove and this was marked in standard blepharoplasty style fashion. Attention was turned to the  right upper eyelid. A #15 blade was used to open the premarked incision line. A skin and muscle flap was excised and hemostasis was obtained with bipolar cautery.   Attention was then turned to the opposite eyelid where the same procedure was performed in the same manner. Hemostasis was obtained with bipolar cautery throughout. All incisions were then closed with a combination of running and interrupted 6-0 fast absorbing plain suture. The patient tolerated the procedure well.  Erythromycin Ophthalmic ointment was applied to his  incision sites, followed by ice packs. He was taken to the recovery area where he recovered without difficulty.  Post-Op Plan/Instructions:  The patient was instructed to use ice packs frequently for the next 48 hours. He was instructed to use erythromycin ophthalmic ointment on his incisions 4 times a day for the next 12 to 14 days. He was given a prescription for Percocet for pain control should Tylenol not be effective. [facility] [place].  Johana Hopkinson M. FVickki Muff M.D. Attending,Ophthalmology

## 2016-10-28 NOTE — Anesthesia Procedure Notes (Signed)
Procedure Name: MAC Date/Time: 10/28/2016 9:16 AM Performed by: Janna Arch Pre-anesthesia Checklist: Patient identified, Emergency Drugs available, Suction available and Patient being monitored Patient Re-evaluated:Patient Re-evaluated prior to inductionOxygen Delivery Method: Nasal cannula

## 2016-10-28 NOTE — Transfer of Care (Signed)
Immediate Anesthesia Transfer of Care Note  Patient: Micheal Johnson.  Procedure(s) Performed: Procedure(s) with comments: BLEPHAROPLASTY upper eyelid with excess skin (Bilateral) - SLEEP APNEA-CPAP/ Anesthesia:  Mac  Patient Location: PACU  Anesthesia Type: MAC  Level of Consciousness: awake, alert  and patient cooperative  Airway and Oxygen Therapy: Patient Spontanous Breathing and Patient connected to supplemental oxygen  Post-op Assessment: Post-op Vital signs reviewed, Patient's Cardiovascular Status Stable, Respiratory Function Stable, Patent Airway and No signs of Nausea or vomiting  Post-op Vital Signs: Reviewed and stable  Complications: No apparent anesthesia complications

## 2016-10-28 NOTE — Anesthesia Postprocedure Evaluation (Signed)
Anesthesia Post Note  Patient: Micheal Johnson.  Procedure(s) Performed: Procedure(s) (LRB): BLEPHAROPLASTY upper eyelid with excess skin (Bilateral)  Patient location during evaluation: PACU Anesthesia Type: MAC Level of consciousness: awake Pain management: pain level controlled Vital Signs Assessment: post-procedure vital signs reviewed and stable Respiratory status: spontaneous breathing Cardiovascular status: blood pressure returned to baseline Postop Assessment: no headache Anesthetic complications: no    Jaci Standard, III,  Zorah Backes D

## 2016-10-28 NOTE — Anesthesia Preprocedure Evaluation (Signed)
Anesthesia Evaluation  Patient identified by MRN, date of birth, ID band Patient awake    Reviewed: Allergy & Precautions, H&P , NPO status , Patient's Chart, lab work & pertinent test results  History of Anesthesia Complications Negative for: history of anesthetic complications  Airway Mallampati: II  TM Distance: >3 FB Neck ROM: full    Dental no notable dental hx.    Pulmonary sleep apnea and Continuous Positive Airway Pressure Ventilation ,    Pulmonary exam normal        Cardiovascular hypertension, On Medications Normal cardiovascular exam     Neuro/Psych negative neurological ROS     GI/Hepatic Neg liver ROS, Medicated,  Endo/Other  negative endocrine ROS  Renal/GU negative Renal ROS  negative genitourinary   Musculoskeletal   Abdominal   Peds  Hematology negative hematology ROS (+)   Anesthesia Other Findings   Reproductive/Obstetrics                             Anesthesia Physical Anesthesia Plan  ASA: II  Anesthesia Plan: MAC   Post-op Pain Management:    Induction:   Airway Management Planned:   Additional Equipment:   Intra-op Plan:   Post-operative Plan:   Informed Consent: I have reviewed the patients History and Physical, chart, labs and discussed the procedure including the risks, benefits and alternatives for the proposed anesthesia with the patient or authorized representative who has indicated his/her understanding and acceptance.     Plan Discussed with:   Anesthesia Plan Comments:         Anesthesia Quick Evaluation

## 2016-10-28 NOTE — Interval H&P Note (Signed)
History and Physical Interval Note:  10/28/2016 9:05 AM  Bonnita Nasuti.  has presented today for surgery, with the diagnosis of H02.831 H02.834 Dermatochalasis  The various methods of treatment have been discussed with the patient and family. After consideration of risks, benefits and other options for treatment, the patient has consented to  Procedure(s) with comments: BLEPHAROPLASTY upper eyelid with excess skin (Bilateral) - SLEEP APNEA-CPAP/ Anesthesia:  Mac as a surgical intervention .  The patient's history has been reviewed, patient examined, no change in status, stable for surgery.  I have reviewed the patient's chart and labs.  Questions were answered to the patient's satisfaction.     Vickki Muff, Jemima Petko M

## 2016-10-29 ENCOUNTER — Encounter: Payer: Self-pay | Admitting: Ophthalmology

## 2016-11-17 ENCOUNTER — Ambulatory Visit
Admission: EM | Admit: 2016-11-17 | Discharge: 2016-11-17 | Disposition: A | Discharge status: Home / Self Care | Payer: 59 | Attending: Family Medicine | Admitting: Family Medicine

## 2016-11-17 DIAGNOSIS — R059 Cough, unspecified: Secondary | ICD-10-CM

## 2016-11-17 DIAGNOSIS — R5383 Other fatigue: Secondary | ICD-10-CM | POA: Diagnosis not present

## 2016-11-17 DIAGNOSIS — R05 Cough: Secondary | ICD-10-CM | POA: Diagnosis not present

## 2016-11-17 MED ORDER — AZITHROMYCIN 250 MG PO TABS: ORAL_TABLET | ORAL | 0 refills | Status: DC

## 2016-11-17 NOTE — ED Triage Notes (Signed)
Pt c/o URI for 2 days, congestion in his chest and stuffy head.

## 2016-11-17 NOTE — ED Provider Notes (Signed)
[facility]    CSN: [id number] Arrival date & time: 11/17/16  1714     History   Chief Complaint Chief Complaint  Patient presents with  . URI    HPI Micheal Johnson Micheal Johnson. is a 61 y.o. male.   The history is provided by the patient.  URI  Presenting symptoms: congestion, cough and fatigue   Severity:  Moderate Onset quality:  Sudden Duration:  4 days Timing:  Constant Progression:  Worsening Chronicity:  New Relieved by:  Nothing Ineffective treatments:  OTC medications Associated symptoms: no headaches, no neck pain, no sinus pain, no swollen glands and no wheezing   Risk factors: not elderly, no chronic cardiac disease, no chronic kidney disease, no chronic respiratory disease, no diabetes mellitus, no immunosuppression, no recent illness, no recent travel and no sick contacts     Past Medical History:  Diagnosis Date  . Arthritis    hands  . Diverticulitis    /OSIS  . GERD (gastroesophageal reflux disease)   . Hypertension   . OSA on CPAP    C-PAP    There are no active problems to display for this patient.   Past Surgical History:  Procedure Laterality Date  . BROW LIFT Bilateral 10/28/2016   Procedure: BLEPHAROPLASTY upper eyelid with excess skin;  Surgeon: Karle Starch, MD;  Location: [place];  Service: Ophthalmology;  Laterality: Bilateral;  SLEEP APNEA-CPAP/ Anesthesia:  Mac  . CIRCUMCISION N/A 03/10/2014   Procedure: CIRCUMCISION ADULT;  Surgeon: Ardis Hughs, MD;  Location: [facility];  Service: Urology;  Laterality: N/A;  . COLONOSCOPY    . HERNIA REPAIR     UMBILICAL HERNIA  . UMBILICAL HERNIA REPAIR  12-02-2013       Home Medications    Prior to Admission medications   Medication Sig Start Date End Date Taking? Authorizing Provider  erythromycin [facility]) ophthalmic ointment Use a small amount on your sutures 4 times a day for the next 2 weeks. Switch to Aquaphor ointment should allergy develop.  10/28/16  Yes Karle Starch, MD  losartan (COZAAR) 100 MG tablet Take 100 mg by mouth every morning.   Yes [provider]  omeprazole (PRILOSEC) 20 MG capsule Take 20 mg by mouth every morning.   Yes [provider]  azithromycin (ZITHROMAX Z-PAK) 250 MG tablet 2 tabs po once day 1, then 1 tab po qd for next 4 days 11/17/16   Norval Gable, MD  Nutritional Supplements (JUICE PLUS FIBRE PO) Take 1 capsule by mouth daily. JUICE PLUS-VEGETABLE BLEND,BERRY BLEND,OMEGA BLEND    [provider]  oxyCODONE-acetaminophen (PERCOCET) 5-325 MG tablet Take 1 tablet by mouth every 4 (four) hours as needed for severe pain. 10/28/16   Karle Starch, MD    Family History History reviewed. No pertinent family history.  Social History Social History  Substance Use Topics  . Smoking status: Never Smoker  . Smokeless tobacco: Never Used  . Alcohol use Yes     Comment: occasional/ 4-5 per week     Allergies   Patient has no known allergies.   Review of Systems Review of Systems  Constitutional: Positive for fatigue.  HENT: Positive for congestion. Negative for sinus pain.   Respiratory: Positive for cough. Negative for wheezing.   Musculoskeletal: Negative for neck pain.  Neurological: Negative for headaches.     Physical Exam Triage Vital Signs ED Triage Vitals  Enc Vitals Group     BP 11/17/16  1839 (!) 158/104     Pulse Rate 11/17/16 1839 99     Resp 11/17/16 1839 18     Temp 11/17/16 1839 98.6 F (37 C)     Temp Source 11/17/16 1839 Oral     SpO2 11/17/16 1839 98 %     Weight 11/17/16 1837 228 lb (103.4 kg)     Height 11/17/16 1837 _0  (1.727 m)     Head Circumference --      Peak Flow --      Pain Score 11/17/16 1838 3     Pain Loc --      Pain Edu? --      Excl. in [place]? --    No data found.   Updated Vital Signs BP (!) 158/104 (BP Location: Left Arm)   Pulse 99   Temp 98.6 F (37 C) (Oral)   Resp 18   Ht _1  (1.727 m)   Wt 228 lb (103.4 kg)    SpO2 98%   BMI 34.67 kg/m   Visual Acuity Right Eye Distance:   Left Eye Distance:   Bilateral Distance:    Right Eye Near:   Left Eye Near:    Bilateral Near:     Physical Exam  Constitutional: He appears well-developed and well-nourished. No distress.  HENT:  Head: Normocephalic and atraumatic.  Right Ear: Tympanic membrane, external ear and ear canal normal.  Left Ear: Tympanic membrane, external ear and ear canal normal.  Nose: Nose normal.  Mouth/Throat: Uvula is midline, oropharynx is clear and moist and mucous membranes are normal. No oropharyngeal exudate or tonsillar abscesses.  Eyes: Conjunctivae and EOM are normal. Pupils are equal, round, and reactive to light. Right eye exhibits no discharge. Left eye exhibits no discharge. No scleral icterus.  Neck: Normal range of motion. Neck supple. No tracheal deviation present. No thyromegaly present.  Cardiovascular: Normal rate, regular rhythm and normal heart sounds.   Pulmonary/Chest: Effort normal and breath sounds normal. No stridor. No respiratory distress. He has no wheezes. He has no rales. He exhibits no tenderness.  Lymphadenopathy:    He has no cervical adenopathy.  Neurological: He is alert.  Skin: Skin is warm and dry. No rash noted. He is not diaphoretic.  Nursing note and vitals reviewed.    UC Treatments / Results  Labs (all labs ordered are listed, but only abnormal results are displayed) Labs Reviewed - No data to display  EKG  EKG Interpretation None       Radiology No results found.  Procedures Procedures (including critical care time)  Medications Ordered in UC Medications - No data to display   Initial Impression / Assessment and Plan / UC Course  I have reviewed the triage vital signs and the nursing notes.  Pertinent labs & imaging results that were available during my care of the patient were reviewed by me and considered in my medical decision making (see chart for details).        Final Clinical Impressions(s) / UC Diagnoses   Final diagnoses:  Cough    New Prescriptions Discharge Medication List as of 11/17/2016  7:38 PM    START taking these medications   Details  azithromycin (ZITHROMAX Z-PAK) 250 MG tablet 2 tabs po once day 1, then 1 tab po qd for next 4 days, Print       1. diagnosis reviewed with patient 2. rx as per orders above; reviewed possible side effects, interactions, risks and benefits; rx printed  for patient to hold since he's traveling out of town; if symptoms worsen over the next week may fill rx and start 3. Recommend supportive treatment with rest, fluids, otc cough med 4. Follow-up prn if symptoms worsen or don't improve   Norval Gable, MD 11/17/16 1944

## 2016-11-25 DIAGNOSIS — Z0000 Encounter for general adult medical examination without abnormal findings: Secondary | ICD-10-CM | POA: Diagnosis not present

## 2017-05-28 DIAGNOSIS — R739 Hyperglycemia, unspecified: Secondary | ICD-10-CM | POA: Diagnosis not present

## 2017-06-02 DIAGNOSIS — R739 Hyperglycemia, unspecified: Secondary | ICD-10-CM | POA: Diagnosis not present

## 2017-06-02 DIAGNOSIS — Z23 Encounter for immunization: Secondary | ICD-10-CM | POA: Diagnosis not present

## 2017-06-02 DIAGNOSIS — I10 Essential (primary) hypertension: Secondary | ICD-10-CM | POA: Diagnosis not present

## 2017-06-11 DIAGNOSIS — L812 Freckles: Secondary | ICD-10-CM | POA: Diagnosis not present

## 2017-06-11 DIAGNOSIS — L821 Other seborrheic keratosis: Secondary | ICD-10-CM | POA: Diagnosis not present

## 2017-06-11 DIAGNOSIS — L570 Actinic keratosis: Secondary | ICD-10-CM | POA: Diagnosis not present

## 2017-06-11 DIAGNOSIS — L820 Inflamed seborrheic keratosis: Secondary | ICD-10-CM | POA: Diagnosis not present

## 2017-06-11 DIAGNOSIS — L578 Other skin changes due to chronic exposure to nonionizing radiation: Secondary | ICD-10-CM | POA: Diagnosis not present

## 2017-06-25 ENCOUNTER — Other Ambulatory Visit: Payer: Self-pay

## 2017-06-25 ENCOUNTER — Ambulatory Visit
Admission: EM | Admit: 2017-06-25 | Discharge: 2017-06-25 | Disposition: A | Discharge status: Home / Self Care | Payer: BLUE CROSS/BLUE SHIELD | Attending: Family Medicine | Admitting: Family Medicine

## 2017-06-25 DIAGNOSIS — J209 Acute bronchitis, unspecified: Secondary | ICD-10-CM | POA: Diagnosis not present

## 2017-06-25 DIAGNOSIS — R05 Cough: Secondary | ICD-10-CM | POA: Diagnosis not present

## 2017-06-25 MED ORDER — HYDROCOD POLST-CPM POLST ER 10-8 MG/5ML PO SUER: 5.0000 mL | Freq: Two times a day (BID) | ORAL | 0 refills | Status: DC | PRN

## 2017-06-25 MED ORDER — DOXYCYCLINE HYCLATE 100 MG PO CAPS: 100.0000 mg | ORAL_CAPSULE | Freq: Two times a day (BID) | ORAL | 0 refills | Status: DC

## 2017-06-25 MED ORDER — PREDNISONE 50 MG PO TABS: ORAL_TABLET | ORAL | 0 refills | Status: DC

## 2017-06-25 NOTE — ED Provider Notes (Signed)
[facility]  CSN: [id number] Arrival date & time: 06/25/17  1026  History   Chief Complaint Chief Complaint  Patient presents with  . Cough   HPI  61 year old male presents with cough and congestion.  Patient states he has been sick for the past week.  Congestion and cough.  Cough is severe and has been worsening.  Has been worsening over the past 2-3 days.  No fevers or chills.  No shortness of breath.  He states that his chest has been hurting from the cough.  He states that he gets lightheaded with severe paroxysms of cough.  He has been using Mucinex with some improvement initially but none currently.  No known exacerbating factors.  No other reported symptoms.  No other complaints at this time.  Past Medical History:  Diagnosis Date  . Arthritis    hands  . Diverticulitis    /OSIS  . GERD (gastroesophageal reflux disease)   . Hypertension   . OSA on CPAP    C-PAP   Past Surgical History:  Procedure Laterality Date  . BROW LIFT Bilateral 10/28/2016   Procedure: BLEPHAROPLASTY upper eyelid with excess skin;  Surgeon: Karle Starch, MD;  Location: [place];  Service: Ophthalmology;  Laterality: Bilateral;  SLEEP APNEA-CPAP/ Anesthesia:  Mac  . CIRCUMCISION N/A 03/10/2014   Procedure: CIRCUMCISION ADULT;  Surgeon: Ardis Hughs, MD;  Location: [facility];  Service: Urology;  Laterality: N/A;  . COLONOSCOPY    . HERNIA REPAIR     UMBILICAL HERNIA  . UMBILICAL HERNIA REPAIR  12-02-2013   Home Medications    Prior to Admission medications   Medication Sig Start Date End Date Taking? Authorizing Provider  hydrochlorothiazide (HYDRODIURIL) 25 MG tablet Take 25 mg by mouth daily.   Yes [provider]  losartan (COZAAR) 100 MG tablet Take 100 mg by mouth every morning.   Yes [provider]  Nutritional Supplements (JUICE PLUS FIBRE PO) Take 1 capsule by mouth daily. JUICE PLUS-VEGETABLE BLEND,BERRY BLEND,OMEGA BLEND    Yes [provider]  omeprazole (PRILOSEC) 20 MG capsule Take 20 mg by mouth every morning.   Yes [provider]  chlorpheniramine-HYDROcodone (TUSSIONEX PENNKINETIC ER) 10-8 MG/5ML SUER Take 5 mLs by mouth every 12 (twelve) hours as needed. 06/25/17   Coral Spikes, DO  doxycycline (VIBRAMYCIN) 100 MG capsule Take 1 capsule (100 mg total) by mouth 2 (two) times daily. 06/25/17   Coral Spikes, DO  predniSONE (DELTASONE) 50 MG tablet 1 tablet daily x 5 days. 06/25/17   Coral Spikes, DO   Family History Family History  Problem Relation Age of Onset  . Non-Hodgkin's lymphoma Father        survivor   Social History Social History   Tobacco Use  . Smoking status: Never Smoker  . Smokeless tobacco: Never Used  Substance Use Topics  . Alcohol use: Yes    Comment: occasional/ 4-5 per week  . Drug use: No   Allergies   Patient has no known allergies.  Review of Systems Review of Systems  Constitutional: Negative.   HENT: Positive for congestion.   Respiratory: Positive for cough and chest tightness.    Physical Exam Triage Vital Signs ED Triage Vitals  Enc Vitals Group     BP 06/25/17 1035 134/90     Pulse Rate 06/25/17 1035 86     Resp 06/25/17 1035 17     Temp 06/25/17 1035 98.4 F (36.9  C)     Temp Source 06/25/17 1035 Oral     SpO2 06/25/17 1035 97 %     Weight 06/25/17 1032 215 lb (97.5 kg)     Height 06/25/17 1032 _0  (1.727 m)     Head Circumference --      Peak Flow --      Pain Score 06/25/17 1032 0     Pain Loc --      Pain Edu? --      Excl. in [place]? --    Updated Vital Signs BP 134/90 (BP Location: Left Arm)   Pulse 86   Temp 98.4 F (36.9 C) (Oral)   Resp 17   Ht _1  (1.727 m)   Wt 215 lb (97.5 kg)   SpO2 97%   BMI 32.69 kg/m     Physical Exam  Constitutional: He is oriented to person, place, and time. He appears well-developed. No distress.  HENT:  Head: Normocephalic and atraumatic.  Nose: Nose normal.  Mouth/Throat:  Oropharynx is clear and moist.  Eyes: Conjunctivae are normal. Right eye exhibits no discharge. Left eye exhibits no discharge.  Cardiovascular: Normal rate and regular rhythm.  Pulmonary/Chest: Effort normal and breath sounds normal. He has no wheezes. He has no rales.  Neurological: He is alert and oriented to person, place, and time.  Psychiatric: He has a normal mood and affect. His behavior is normal.  Vitals reviewed.  UC Treatments / Results  Labs (all labs ordered are listed, but only abnormal results are displayed) Labs Reviewed - No data to display  EKG  EKG Interpretation None       Radiology No results found.  Procedures Procedures (including critical care time)  Medications Ordered in UC Medications - No data to display   Initial Impression / Assessment and Plan / UC Course  I have reviewed the triage vital signs and the nursing notes.  Pertinent labs & imaging results that were available during my care of the patient were reviewed by me and considered in my medical decision making (see chart for details).     61 year old male presents with acute bronchitis.  Treating with prednisone and Tussionex.  Doxycycline if he fails to improve or worsens.  Final Clinical Impressions(s) / UC Diagnoses   Final diagnoses:  Acute bronchitis, unspecified organism    ED Discharge Orders        Ordered    predniSONE (DELTASONE) 50 MG tablet     06/25/17 1135    chlorpheniramine-HYDROcodone (TUSSIONEX PENNKINETIC ER) 10-8 MG/5ML SUER  Every 12 hours PRN     06/25/17 1135    doxycycline (VIBRAMYCIN) 100 MG capsule  2 times daily     06/25/17 1135     Controlled Substance Prescriptions [place] Controlled Substance Registry consulted? No   Coral Spikes, [place] 06/25/17 1141

## 2017-06-25 NOTE — ED Triage Notes (Signed)
Patient complains of cough, congestion. Patient states that he has been having symptoms x 1 week. Patient reports that he has been using Mucinex without relief.

## 2017-06-25 NOTE — Discharge Instructions (Signed)
Bronchitis is typically viral.  Take the prednisone and use the cough medication.  If you fail to improve or worsen, start the doxycycline.  Take care  Dr. Lacinda Axon

## 2017-11-26 DIAGNOSIS — R739 Hyperglycemia, unspecified: Secondary | ICD-10-CM | POA: Diagnosis not present

## 2017-11-26 DIAGNOSIS — I10 Essential (primary) hypertension: Secondary | ICD-10-CM | POA: Diagnosis not present

## 2018-06-09 DIAGNOSIS — H5203 Hypermetropia, bilateral: Secondary | ICD-10-CM | POA: Diagnosis not present

## 2018-06-17 DIAGNOSIS — Z1283 Encounter for screening for malignant neoplasm of skin: Secondary | ICD-10-CM | POA: Diagnosis not present

## 2018-06-17 DIAGNOSIS — L578 Other skin changes due to chronic exposure to nonionizing radiation: Secondary | ICD-10-CM | POA: Diagnosis not present

## 2018-06-17 DIAGNOSIS — L570 Actinic keratosis: Secondary | ICD-10-CM | POA: Diagnosis not present

## 2018-06-17 DIAGNOSIS — L821 Other seborrheic keratosis: Secondary | ICD-10-CM | POA: Diagnosis not present

## 2019-04-07 ENCOUNTER — Other Ambulatory Visit: Admission: RE | Admit: 2019-04-07 | Payer: 59 | Source: Ambulatory Visit

## 2019-04-08 ENCOUNTER — Encounter: Payer: Self-pay | Admitting: *Deleted

## 2019-04-11 ENCOUNTER — Ambulatory Visit
Admission: RE | Admit: 2019-04-11 | Discharge: 2019-04-11 | Disposition: A | Discharge status: Home / Self Care | Payer: BC Managed Care – PPO | Attending: Gastroenterology | Admitting: Gastroenterology

## 2019-04-11 ENCOUNTER — Encounter: Payer: Self-pay | Admitting: Anesthesiology

## 2019-04-11 ENCOUNTER — Ambulatory Visit: Payer: BC Managed Care – PPO | Admitting: Anesthesiology

## 2019-04-11 ENCOUNTER — Encounter
Admission: RE | Disposition: A | Discharge status: Home / Self Care | Payer: Self-pay | Source: Home / Self Care | Attending: Gastroenterology

## 2019-04-11 DIAGNOSIS — Z8371 Family history of colonic polyps: Secondary | ICD-10-CM | POA: Diagnosis not present

## 2019-04-11 DIAGNOSIS — K635 Polyp of colon: Secondary | ICD-10-CM | POA: Insufficient documentation

## 2019-04-11 DIAGNOSIS — G4733 Obstructive sleep apnea (adult) (pediatric): Secondary | ICD-10-CM | POA: Diagnosis not present

## 2019-04-11 DIAGNOSIS — D128 Benign neoplasm of rectum: Secondary | ICD-10-CM | POA: Insufficient documentation

## 2019-04-11 DIAGNOSIS — K5730 Diverticulosis of large intestine without perforation or abscess without bleeding: Secondary | ICD-10-CM | POA: Insufficient documentation

## 2019-04-11 DIAGNOSIS — I10 Essential (primary) hypertension: Secondary | ICD-10-CM | POA: Insufficient documentation

## 2019-04-11 DIAGNOSIS — Z1211 Encounter for screening for malignant neoplasm of colon: Secondary | ICD-10-CM | POA: Diagnosis not present

## 2019-04-11 DIAGNOSIS — K219 Gastro-esophageal reflux disease without esophagitis: Secondary | ICD-10-CM | POA: Insufficient documentation

## 2019-04-11 DIAGNOSIS — Z7982 Long term (current) use of aspirin: Secondary | ICD-10-CM | POA: Diagnosis not present

## 2019-04-11 DIAGNOSIS — Z79899 Other long term (current) drug therapy: Secondary | ICD-10-CM | POA: Insufficient documentation

## 2019-04-11 DIAGNOSIS — K640 First degree hemorrhoids: Secondary | ICD-10-CM | POA: Insufficient documentation

## 2019-04-11 HISTORY — PX: COLONOSCOPY WITH PROPOFOL: SHX5780

## 2019-04-11 SURGERY — COLONOSCOPY WITH PROPOFOL
Anesthesia: General

## 2019-04-11 MED ORDER — PROPOFOL 10 MG/ML IV BOLUS
INTRAVENOUS | Status: DC | PRN
  Administered 2019-04-11: 100 mg via INTRAVENOUS

## 2019-04-11 MED ORDER — PROPOFOL 10 MG/ML IV BOLUS
INTRAVENOUS | Status: AC
  Filled 2019-04-11: qty 20

## 2019-04-11 MED ORDER — PROPOFOL 500 MG/50ML IV EMUL
INTRAVENOUS | Status: DC | PRN
  Administered 2019-04-11: 175 ug/kg/min via INTRAVENOUS

## 2019-04-11 MED ORDER — PROPOFOL 500 MG/50ML IV EMUL
INTRAVENOUS | Status: AC
  Filled 2019-04-11: qty 50

## 2019-04-11 MED ORDER — SODIUM CHLORIDE 0.9 % IV SOLN
INTRAVENOUS | Status: DC
  Administered 2019-04-11: 1000 mL via INTRAVENOUS

## 2019-04-11 NOTE — H&P (Signed)
Outpatient short stay form Pre-procedure 04/11/2019 1:56 PM Lollie Sails MD  Primary Physician: Dr. Emily Filbert  Reason for visit: Colonoscopy  History of present illness: Patient is a 63 year old male presenting today for colonoscopy in regards to family history of colon polyps in a primary relative, mother.  Patient had a colonoscopy on 04/03/2008 that was negative for polyps.  He tolerated his prep well.  He takes no aspirin or blood thinner with the exception of an 81 mg daily aspirin that was held today.    Current Facility-Administered Medications:  .  0.9 %  sodium chloride infusion, , Intravenous, Continuous, Lollie Sails, MD, Last Rate: 20 mL/hr at 04/11/19 1319, 1,000 mL at 04/11/19 1319  Medications Prior to Admission  Medication Sig Dispense Refill Last Dose  . aspirin EC 81 MG tablet Take 81 mg by mouth daily.   04/10/2019 at Unknown time  . hydrochlorothiazide (HYDRODIURIL) 25 MG tablet Take 25 mg by mouth daily.   Past Week at Unknown time  . losartan (COZAAR) 100 MG tablet Take 100 mg by mouth every morning.   04/10/2019 at Unknown time  . omeprazole (PRILOSEC) 20 MG capsule Take 20 mg by mouth every morning.   04/10/2019 at Unknown time  . rosuvastatin (CRESTOR) 5 MG tablet Take 5 mg by mouth daily.   04/10/2019 at Unknown time  . chlorpheniramine-HYDROcodone (TUSSIONEX PENNKINETIC ER) 10-8 MG/5ML SUER Take 5 mLs by mouth every 12 (twelve) hours as needed. 115 mL 0  at prn  . doxycycline (VIBRAMYCIN) 100 MG capsule Take 1 capsule (100 mg total) by mouth 2 (two) times daily. (Patient not taking: Reported on 04/11/2019) 14 capsule 0 Not Taking at Unknown time  . Nutritional Supplements (JUICE PLUS FIBRE PO) Take 1 capsule by mouth daily. JUICE PLUS-VEGETABLE BLEND,BERRY BLEND,OMEGA BLEND     . predniSONE (DELTASONE) 50 MG tablet 1 tablet daily x 5 days. (Patient not taking: Reported on 04/11/2019) 5 tablet 0 Completed Course at Unknown time     No Known  Allergies   Past Medical History:  Diagnosis Date  . Arthritis    hands  . Diverticulitis    /OSIS  . GERD (gastroesophageal reflux disease)   . Hypertension   . OSA on CPAP    C-PAP    Review of systems:      Physical Exam    Heart and lungs: Regular rate and rhythm without rub or gallop lungs are bilaterally clear    HEENT: Normocephalic atraumatic eyes are anicteric    Other:    Pertinant exam for procedure: Soft nontender nondistended bowel sounds positive normoactive    Planned proceedures: Colonoscopy and indicated procedures. I have discussed the risks benefits and complications of procedures to include not limited to bleeding, infection, perforation and the risk of sedation and the patient wishes to proceed.    Lollie Sails, MD Gastroenterology 04/11/2019  1:56 PM

## 2019-04-11 NOTE — Op Note (Signed)
[facility] Patient Name: Micheal Johnson Procedure Date: 04/11/2019 1:59 PM MRN: [id number] Account #: [id number] Date of Birth: August 20, 1955 Admit Type: Outpatient Age: 63 Room: [facility] ENDO ROOM 1 Gender: Male Note Status: Finalized Procedure:            Colonoscopy Indications:          Family history of colonic polyps in a first-degree                        relative Providers:            Lollie Sails, MD Referring MD:         No Local Md, MD (Referring MD) Medicines:            Monitored Anesthesia Care Complications:        No immediate complications. Procedure:            Pre-Anesthesia Assessment:                       - ASA Grade Assessment: III - A patient with severe                        systemic disease.                       After obtaining informed consent, the colonoscope was                        passed under direct vision. Throughout the procedure,                        the patient's blood pressure, pulse, and oxygen                        saturations were monitored continuously. The                        Colonoscope was introduced through the anus and                        advanced to the the cecum, identified by appendiceal                        orifice and ileocecal valve. The colonoscopy was                        performed without difficulty. The patient tolerated the                        procedure well. The quality of the bowel preparation                        was good. Findings:      A 4 mm polyp was found in the rectum. The polyp was semi-pedunculated.       The polyp was removed with a cold snare. Resection and retrieval were       complete.      A 4 mm polyp was found in the mid descending colon. The polyp was       sessile. The polyp was removed with a cold snare. Resection and  retrieval were complete.      Multiple small and large-mouthed diverticula were found in the sigmoid       colon and  descending colon.      Non-bleeding internal hemorrhoids were found during retroflexion. The       hemorrhoids were small and Grade I (internal hemorrhoids that do not       prolapse). Impression:           - One 4 mm polyp in the rectum, removed with a cold                        snare. Resected and retrieved.                       - One 4 mm polyp in the mid descending colon, removed                        with a cold snare. Resected and retrieved.                       - Diverticulosis in the sigmoid colon and in the                        descending colon.                       - Non-bleeding internal hemorrhoids. Recommendation:       - Await pathology results.                       - Telephone GI clinic for pathology results in 5 days. Procedure Code(s):    --- Professional ---                       [phone], Colonoscopy, flexible; with removal of tumor(s),                        polyp(s), or other lesion(s) by snare technique Diagnosis Code(s):    --- Professional ---                       K62.1, Rectal polyp                       K63.5, Polyp of colon                       K64.0, First degree hemorrhoids                       Z83.71, Family history of colonic polyps                       K57.30, Diverticulosis of large intestine without                        perforation or abscess without bleeding CPT copyright 2019 American Medical Association. All rights reserved. The codes documented in this report are preliminary and upon coder review may  be revised to meet current compliance requirements. Lollie Sails, MD 04/11/2019 2:37:57 PM This report has been signed electronically. Number of Addenda: 0 Note Initiated On: 04/11/2019 1:59 PM Scope Withdrawal Time: 0 hours 10 minutes  10 seconds  Total Procedure Duration: 0 hours 22 minutes 53 seconds       [facility]

## 2019-04-11 NOTE — Transfer of Care (Signed)
Immediate Anesthesia Transfer of Care Note  Patient: Micheal Johnson.  Procedure(s) Performed: COLONOSCOPY WITH PROPOFOL (N/A )  Patient Location: PACU  Anesthesia Type:General  Level of Consciousness: awake  Airway & Oxygen Therapy: Patient Spontanous Breathing and Patient connected to face mask oxygen  Post-op Assessment: Report given to RN and Post -op Vital signs reviewed and stable  Post vital signs: Reviewed and stable  Last Vitals:  Vitals Value Taken Time  BP 125/82 04/11/19 1437  Temp    Pulse 100 04/11/19 1440  Resp 23 04/11/19 1440  SpO2 96 % 04/11/19 1440  Vitals shown include unvalidated device data.  Last Pain:  Vitals:   04/11/19 1437  TempSrc:   PainSc: 0-No pain         Complications: No apparent anesthesia complications

## 2019-04-11 NOTE — Anesthesia Postprocedure Evaluation (Signed)
Anesthesia Post Note  Patient: Micheal Johnson.  Procedure(s) Performed: COLONOSCOPY WITH PROPOFOL (N/A )  Patient location during evaluation: Endoscopy Anesthesia Type: General Level of consciousness: awake and alert and oriented Pain management: pain level controlled Vital Signs Assessment: post-procedure vital signs reviewed and stable Respiratory status: spontaneous breathing, nonlabored ventilation and respiratory function stable Cardiovascular status: blood pressure returned to baseline and stable Postop Assessment: no signs of nausea or vomiting Anesthetic complications: no     Last Vitals:  Vitals:   04/11/19 1257 04/11/19 1437  BP: (!) 136/109 125/82  Pulse: 87 (!) 101  Resp: 18 10  Temp: (!) 35.9 C   SpO2: 99% 97%    Last Pain:  Vitals:   04/11/19 1437  TempSrc:   PainSc: 0-No pain                 Kallin Henk

## 2019-04-11 NOTE — Anesthesia Preprocedure Evaluation (Signed)
Anesthesia Evaluation  Patient identified by MRN, date of birth, ID band Patient awake    Reviewed: Allergy & Precautions, NPO status , Patient's Chart, lab work & pertinent test results, reviewed documented beta blocker date and time   Airway Mallampati: III  TM Distance: >3 FB     Dental  (+) Chipped   Pulmonary sleep apnea and Continuous Positive Airway Pressure Ventilation ,           Cardiovascular hypertension, Pt. on medications      Neuro/Psych    GI/Hepatic GERD  ,  Endo/Other    Renal/GU      Musculoskeletal  (+) Arthritis ,   Abdominal   Peds  Hematology   Anesthesia Other Findings   Reproductive/Obstetrics                             Anesthesia Physical Anesthesia Plan  ASA: III  Anesthesia Plan: General   Post-op Pain Management:    Induction: Intravenous  PONV Risk Score and Plan:   Airway Management Planned:   Additional Equipment:   Intra-op Plan:   Post-operative Plan:   Informed Consent: I have reviewed the patients History and Physical, chart, labs and discussed the procedure including the risks, benefits and alternatives for the proposed anesthesia with the patient or authorized representative who has indicated his/her understanding and acceptance.       Plan Discussed with: CRNA  Anesthesia Plan Comments:         Anesthesia Quick Evaluation

## 2019-04-11 NOTE — Anesthesia Post-op Follow-up Note (Signed)
Anesthesia QCDR form completed.        

## 2019-04-12 ENCOUNTER — Encounter: Payer: Self-pay | Admitting: Gastroenterology

## 2019-04-14 LAB — SURGICAL PATHOLOGY

## 2019-10-10 ENCOUNTER — Ambulatory Visit: Payer: BC Managed Care – PPO

## 2020-05-23 ENCOUNTER — Ambulatory Visit
Admission: EM | Admit: 2020-05-23 | Discharge: 2020-05-23 | Disposition: A | Discharge status: Home / Self Care | Payer: 59 | Attending: Emergency Medicine | Admitting: Emergency Medicine

## 2020-05-23 ENCOUNTER — Other Ambulatory Visit: Payer: Self-pay

## 2020-05-23 DIAGNOSIS — J069 Acute upper respiratory infection, unspecified: Secondary | ICD-10-CM | POA: Diagnosis not present

## 2020-05-23 DIAGNOSIS — Z20822 Contact with and (suspected) exposure to covid-19: Secondary | ICD-10-CM | POA: Diagnosis not present

## 2020-05-23 LAB — RESP PANEL BY RT-PCR (FLU A&B, COVID) ARPGX2
Influenza A by PCR: NEGATIVE
Influenza B by PCR: NEGATIVE
SARS Coronavirus 2 by RT PCR: NEGATIVE

## 2020-05-23 MED ORDER — BENZONATATE 200 MG PO CAPS: 200.0000 mg | ORAL_CAPSULE | Freq: Three times a day (TID) | ORAL | 0 refills | Status: AC | PRN

## 2020-05-23 NOTE — ED Provider Notes (Signed)
HPI  SUBJECTIVE:  Micheal Johnson. is a 64 y.o. male who presents with days of worsening nasal congestion after blowing leaves.  Reports intermittent rhinorrhea, postnasal drip and a cough.  No fevers, body aches, headaches, wheezing, shortness of breath, nausea, vomiting, diarrhea, abdominal pain.  No sinus pain or pressure.  No allergy or GERD symptoms.  He is requesting Covid testing.  No known exposure to Covid.  He got the second dose of [employer] vaccine in July.  No Antibiotics in the past month.  No antipyretic in the past 6 hours.  He uses Flonase daily.  No alleviating factors.  Cough is worse with talking.  He has a past medical history of GERD, hypertension, borderline diabetes, hypercholesterolemia.  No history of pulmonary disease, smoking, frequent sinusitis.  PMD: Rusty Aus, MD   Past Medical History:  Diagnosis Date  . Arthritis    hands  . Diverticulitis    /OSIS  . GERD (gastroesophageal reflux disease)   . Hypertension   . OSA on CPAP    C-PAP    Past Surgical History:  Procedure Laterality Date  . BROW LIFT Bilateral 10/28/2016   Procedure: BLEPHAROPLASTY upper eyelid with excess skin;  Surgeon: Karle Starch, MD;  Location: [place];  Service: Ophthalmology;  Laterality: Bilateral;  SLEEP APNEA-CPAP/ Anesthesia:  Mac  . CIRCUMCISION N/A 03/10/2014   Procedure: CIRCUMCISION ADULT;  Surgeon: Ardis Hughs, MD;  Location: [facility];  Service: Urology;  Laterality: N/A;  . COLONOSCOPY    . COLONOSCOPY WITH PROPOFOL N/A 04/11/2019   Procedure: COLONOSCOPY WITH PROPOFOL;  Surgeon: Lollie Sails, MD;  Location: [facility] ENDOSCOPY;  Service: Endoscopy;  Laterality: N/A;  [place] HAS HARD COPY OF POSITIVE FROM SEPTEMBER; ATTACHED TO CHART  . HERNIA REPAIR     UMBILICAL HERNIA  . UMBILICAL HERNIA REPAIR  12-02-2013  . VASECTOMY      Family History  Problem Relation Age of Onset  . Non-Hodgkin's lymphoma Father        survivor     Social History   Tobacco Use  . Smoking status: Never Smoker  . Smokeless tobacco: Never Used  Vaping Use  . Vaping Use: Never used  Substance Use Topics  . Alcohol use: Yes    Comment: occasional/ 4-5 per week  . Drug use: Never    No current facility-administered medications for this encounter.  Current Outpatient Medications:  .  aspirin EC 81 MG tablet, Take 81 mg by mouth daily., Disp: , Rfl:  .  hydrochlorothiazide (HYDRODIURIL) 25 MG tablet, Take 25 mg by mouth daily., Disp: , Rfl:  .  losartan (COZAAR) 100 MG tablet, Take 100 mg by mouth every morning., Disp: , Rfl:  .  Nutritional Supplements (JUICE PLUS FIBRE PO), Take 1 capsule by mouth daily. JUICE PLUS-VEGETABLE BLEND,BERRY BLEND,OMEGA BLEND, Disp: , Rfl:  .  omeprazole (PRILOSEC) 20 MG capsule, Take 20 mg by mouth every morning., Disp: , Rfl:  .  rosuvastatin (CRESTOR) 5 MG tablet, Take 5 mg by mouth daily., Disp: , Rfl:  .  benzonatate (TESSALON) 200 MG capsule, Take 1 capsule (200 mg total) by mouth 3 (three) times daily as needed for cough., Disp: 30 capsule, Rfl: 0  No Known Allergies   ROS  As noted in HPI.   Physical Exam  BP (!) 154/98 (BP Location: Right Arm)   Pulse 87   Temp 98.7 F (37.1 C) (Oral)   Resp 16  Ht _0  (1.727 m)   Wt 98.9 kg   SpO2 99%   BMI 33.15 kg/m   Constitutional: Well developed, well nourished, no acute distress Eyes:  EOMI, conjunctiva normal bilaterally HENT: Normocephalic, atraumatic,mucus membranes moist.  No nasal congestion.  No postnasal drip.  Positive cobblestoning.  No maxillary, frontal sinus tenderness. Respiratory: Normal inspiratory effort, lungs clear bilaterally, good air movement Cardiovascular: Normal rate regular rhythm no murmurs rubs or gallops GI: nondistended skin: No rash, skin intact Musculoskeletal: no deformities Neurologic: Alert & oriented x 3, no focal neuro deficits Psychiatric: Speech and behavior appropriate   ED  Course   Medications - No data to display  Orders Placed This Encounter  Procedures  . Resp Panel by RT-PCR (Flu A&B, Covid) Nasopharyngeal Swab    Standing Status:   Standing    Number of Occurrences:   1    Order Specific Question:   Is this test for diagnosis or screening    Answer:   Diagnosis of ill patient    Order Specific Question:   Symptomatic for COVID-19 as defined by CDC    Answer:   Yes    Order Specific Question:   Date of Symptom Onset    Answer:   05/21/2020    Order Specific Question:   Hospitalized for COVID-19    Answer:   No    Order Specific Question:   Admitted to ICU for COVID-19    Answer:   No    Order Specific Question:   Previously tested for COVID-19    Answer:   No    Order Specific Question:   Resident in a congregate (group) care setting    Answer:   No    Order Specific Question:   Employed in healthcare setting    Answer:   No    Order Specific Question:   Has patient completed COVID vaccination(s) (2 doses of Pfizer/Moderna 1 dose of [employer])    Answer:   No  . Airborne precautions    Standing Status:   Standing    Number of Occurrences:   1    Results for orders placed or performed during the hospital encounter of 05/23/20 (from the past 24 hour(s))  Resp Panel by RT-PCR (Flu A&B, Covid) Nasopharyngeal Swab     Status: None   Collection Time: 05/23/20  9:10 AM   Specimen: Nasopharyngeal Swab; Nasopharyngeal(NP) swabs in vial transport medium  Result Value Ref Range   SARS Coronavirus 2 by RT PCR NEGATIVE NEGATIVE   Influenza A by PCR NEGATIVE NEGATIVE   Influenza B by PCR NEGATIVE NEGATIVE   No results found.  ED Clinical Impression  1. Upper respiratory tract infection, unspecified type   2. Encounter for laboratory testing for COVID-19 virus      ED Assessment/Plan  Presentation consistent with URI.  We will have him start Mucinex, continue Flonase, Tessalon, saline nasal irrigation.  We will contact patient at  [phone] if either his Covid or flu come back positive.  He will be a candidate for monoclonal antibody infusion based on BMI, history of hypertension.  Covid, influenza A & B PCR negative.  Plan as above.  Discussed labs,  MDM, treatment plan, and plan for follow-up with patient. . patient agrees with plan.   Meds ordered this encounter  Medications  . benzonatate (TESSALON) 200 MG capsule    Sig: Take 1 capsule (200 mg total) by mouth 3 (three) times daily as needed for  cough.    Dispense:  30 capsule    Refill:  0    *This clinic note was created using Lobby[employer]. Therefore, there may be occasional mistakes despite careful proofreading.   ?    Melynda Ripple, MD 05/23/20 1122

## 2020-05-23 NOTE — Discharge Instructions (Signed)
start Mucinex, continue Flonase, Tessalon, start saline nasal irrigation with a Milta Deiters med rinse and distilled water as often as you want to help prevent bacterial sinus infection and to reduce the amount of postnasal drip.  We will contact you if either your Covid or flu come back positive and will initiate the appropriate next steps.

## 2020-05-23 NOTE — ED Triage Notes (Signed)
Patient complains of nasal congestion and cough x Monday. States that everyday seems to be worsening. Would like to have covid testing.

## 2022-06-27 ENCOUNTER — Ambulatory Visit
Admission: RE | Admit: 2022-06-27 | Discharge: 2022-06-27 | Disposition: A | Discharge status: Home / Self Care | Payer: 59 | Source: Ambulatory Visit

## 2022-06-27 VITALS — BP 133/95 | HR 90 | Temp 98.0°F | Resp 20 | Ht 68.0 in | Wt 224.0 lb

## 2022-06-27 DIAGNOSIS — J0100 Acute maxillary sinusitis, unspecified: Secondary | ICD-10-CM | POA: Diagnosis not present

## 2022-06-27 DIAGNOSIS — J209 Acute bronchitis, unspecified: Secondary | ICD-10-CM | POA: Diagnosis not present

## 2022-06-27 MED ORDER — AMOXICILLIN-POT CLAVULANATE 875-125 MG PO TABS: 1.0000 | ORAL_TABLET | Freq: Two times a day (BID) | ORAL | 0 refills | Status: AC

## 2022-06-27 MED ORDER — PREDNISONE 10 MG (21) PO TBPK: ORAL_TABLET | Freq: Every day | ORAL | 0 refills | Status: AC

## 2022-06-27 MED ORDER — ALBUTEROL SULFATE HFA 108 (90 BASE) MCG/ACT IN AERS: 1.0000 | INHALATION_SPRAY | Freq: Four times a day (QID) | RESPIRATORY_TRACT | 0 refills | Status: AC | PRN

## 2022-06-27 NOTE — Discharge Instructions (Addendum)
Take the Augmentin and prednisone as directed.  Use the albuterol inhaler as directed.  Follow up with your primary care provider if your symptoms are not improving.

## 2022-06-27 NOTE — ED Triage Notes (Signed)
Patient to Urgent Care with complaints of nagging cough x3 weeks. Reports worsening chest congestion. States that his cough is productive with discolored sputum.  Denies any known fevers.  Has been taking mucinex-DM/ coricidin.

## 2022-06-27 NOTE — ED Provider Notes (Signed)
Micheal Johnson    CSN: [id number] Arrival date & time: 06/27/22  1006      History   Chief Complaint Chief Complaint  Patient presents with   Cough    Had cough and congestion for going on three weeks. Not getting any better - Entered by patient    HPI Micheal Johnson. is a 66 y.o. male.  Patient presents with cough x 3 weeks.  His cough is getting worse and is now productive.  He also has sinus congestion, runny nose, postnasal drip, sinus pressure x 1 week.  Treatment attempted with OTC cold and sinus medication.  No fever, sore throat, chest pain, shortness of breath, vomiting, diarrhea, or other symptoms.  His medical history includes hypertension, diabetes, OSA, GERD, diverticulitis, arthritis.    The history is provided by the patient and medical records.    Past Medical History:  Diagnosis Date   Arthritis    hands   Diverticulitis    /OSIS   GERD (gastroesophageal reflux disease)    Hypertension    OSA on CPAP    C-PAP    There are no problems to display for this patient.   Past Surgical History:  Procedure Laterality Date   BROW LIFT Bilateral 10/28/2016   Procedure: BLEPHAROPLASTY upper eyelid with excess skin;  Surgeon: Karle Starch, MD;  Location: [place];  Service: Ophthalmology;  Laterality: Bilateral;  SLEEP APNEA-CPAP/ Anesthesia:  Mac   CIRCUMCISION N/A 03/10/2014   Procedure: CIRCUMCISION ADULT;  Surgeon: Ardis Hughs, MD;  [facility];  Service: Urology;  Laterality: N/A;   COLONOSCOPY     COLONOSCOPY WITH PROPOFOL N/A 04/11/2019   Procedure: COLONOSCOPY WITH PROPOFOL;  Surgeon: Lollie Sails, MD;  Location: [facility] ENDOSCOPY;  Service: Endoscopy;  Laterality: N/A;  [place] HAS HARD COPY OF POSITIVE FROM SEPTEMBER; ATTACHED TO CHART   HERNIA REPAIR     UMBILICAL HERNIA   UMBILICAL HERNIA REPAIR  12-02-2013   VASECTOMY         Home Medications    Prior to Admission medications   Medication Sig  Start Date End Date Taking? Authorizing Provider  albuterol (VENTOLIN HFA) 108 (90 Base) MCG/ACT inhaler Inhale 1-2 puffs into the lungs every 6 (six) hours as needed. 06/27/22  Yes Sharion Balloon, NP  amoxicillin-clavulanate (AUGMENTIN) 875-125 MG tablet Take 1 tablet by mouth every 12 (twelve) hours for 10 days. 06/27/22 07/07/22 Yes Sharion Balloon, NP  predniSONE (STERAPRED UNI-PAK 21 TAB) 10 MG (21) TBPK tablet Take by mouth daily. As directed 06/27/22  Yes Sharion Balloon, NP  aspirin EC 81 MG tablet Take 81 mg by mouth daily.    [provider]  benzonatate (TESSALON) 200 MG capsule Take 1 capsule (200 mg total) by mouth 3 (three) times daily as needed for cough. 05/23/20   Melynda Ripple, MD  hydrochlorothiazide (HYDRODIURIL) 25 MG tablet Take 25 mg by mouth daily.    [provider]  losartan (COZAAR) 100 MG tablet Take 100 mg by mouth every morning.    [provider]  metFORMIN (GLUCOPHAGE) 500 MG tablet Take 500 mg by mouth 2 (two) times daily.    [provider]  Nutritional Supplements (JUICE PLUS FIBRE PO) Take 1 capsule by mouth daily. JUICE PLUS-VEGETABLE BLEND,BERRY BLEND,OMEGA BLEND    [provider]  omeprazole (PRILOSEC) 20 MG capsule Take 20 mg by mouth every morning.    [provider]  rosuvastatin (CRESTOR)  5 MG tablet Take 5 mg by mouth daily.    [provider]    Family History Family History  Problem Relation Age of Onset   Non-Hodgkin's lymphoma Father        survivor    Social History Social History   Tobacco Use   Smoking status: Never   Smokeless tobacco: Never  Vaping Use   Vaping Use: Never used  Substance Use Topics   Alcohol use: Yes    Comment: occasional/ 4-5 per week   Drug use: Never     Allergies   Patient has no known allergies.   Review of Systems Review of Systems  Constitutional:  Negative for chills and fever.  HENT:  Positive for congestion, postnasal drip, rhinorrhea  and sinus pressure. Negative for ear pain and sore throat.   Respiratory:  Positive for cough. Negative for shortness of breath.   Cardiovascular:  Negative for chest pain and palpitations.  Gastrointestinal:  Negative for abdominal pain, diarrhea and vomiting.  Skin:  Negative for color change and rash.  All other systems reviewed and are negative.    Physical Exam Triage Vital Signs ED Triage Vitals  Enc Vitals Group     BP 06/27/22 1025 (!) 133/95     Pulse Rate 06/27/22 1025 90     Resp 06/27/22 1025 20     Temp 06/27/22 1025 98 F (36.7 C)     Temp Source 06/27/22 1025 Oral     SpO2 06/27/22 1025 98 %     Weight 06/27/22 1023 224 lb (101.6 kg)     Height 06/27/22 1023 _0  (1.727 m)     Head Circumference --      Peak Flow --      Pain Score 06/27/22 1023 0     Pain Loc --      Pain Edu? --      Excl. in [place]? --    No data found.  Updated Vital Signs BP (!) 133/95   Pulse 90   Temp 98 F (36.7 C) (Oral)   Resp 20   Ht _1  (1.727 m)   Wt 224 lb (101.6 kg)   SpO2 98%   BMI 34.06 kg/m   Visual Acuity Right Eye Distance:   Left Eye Distance:   Bilateral Distance:    Right Eye Near:   Left Eye Near:    Bilateral Near:     Physical Exam Vitals and nursing note reviewed.  Constitutional:      General: He is not in acute distress.    Appearance: He is well-developed. He is not ill-appearing.  HENT:     Right Ear: Tympanic membrane normal.     Left Ear: Tympanic membrane normal.     Nose: Congestion and rhinorrhea present.     Mouth/Throat:     Mouth: Mucous membranes are moist.     Pharynx: Oropharynx is clear.  Cardiovascular:     Rate and Rhythm: Normal rate and regular rhythm.     Heart sounds: Normal heart sounds.  Pulmonary:     Effort: Pulmonary effort is normal. No respiratory distress.     Breath sounds: Normal breath sounds.  Musculoskeletal:     Cervical back: Neck supple.  Skin:    General: Skin is warm and dry.  Neurological:      Mental Status: He is alert.  Psychiatric:        Mood and Affect: Mood normal.  Behavior: Behavior normal.      UC Treatments / Results  Labs (all labs ordered are listed, but only abnormal results are displayed) Labs Reviewed - No data to display  EKG   Radiology No results found.  Procedures Procedures (including critical care time)  Medications Ordered in UC Medications - No data to display  Initial Impression / Assessment and Plan / UC Course  I have reviewed the triage vital signs and the nursing notes.  Pertinent labs & imaging results that were available during my care of the patient were reviewed by me and considered in my medical decision making (see chart for details).    Acute sinusitis and bronchitis.  Patient has been symptomatic for 3 weeks; worse x 1 week despite OTC treatments.  Treating today with albuterol inhaler, prednisone, and Augmentin.  Instructed patient to follow up with his PCP if his symptoms are not improving.  He agrees to plan of care.    Final Clinical Impressions(s) / UC Diagnoses   Final diagnoses:  Acute non-recurrent maxillary sinusitis  Acute bronchitis, unspecified organism     Discharge Instructions      Take the Augmentin and prednisone as directed.  Use the albuterol inhaler as directed.  Follow up with your primary care provider if your symptoms are not improving.        ED Prescriptions     Medication Sig Dispense Auth. Provider   albuterol (VENTOLIN HFA) 108 (90 Base) MCG/ACT inhaler Inhale 1-2 puffs into the lungs every 6 (six) hours as needed. 18 g Sharion Balloon, NP   predniSONE (STERAPRED UNI-PAK 21 TAB) 10 MG (21) TBPK tablet Take by mouth daily. As directed 21 tablet Sharion Balloon, NP   amoxicillin-clavulanate (AUGMENTIN) 875-125 MG tablet Take 1 tablet by mouth every 12 (twelve) hours for 10 days. 20 tablet Sharion Balloon, NP      PDMP not reviewed this encounter.   Sharion Balloon, NP 06/27/22  1054

## 2023-01-22 ENCOUNTER — Other Ambulatory Visit: Payer: Self-pay | Admitting: Internal Medicine

## 2023-01-22 DIAGNOSIS — E782 Mixed hyperlipidemia: Secondary | ICD-10-CM

## 2023-01-22 DIAGNOSIS — Z0000 Encounter for general adult medical examination without abnormal findings: Secondary | ICD-10-CM

## 2023-01-26 ENCOUNTER — Ambulatory Visit
Admission: RE | Admit: 2023-01-26 | Discharge: 2023-01-26 | Disposition: A | Discharge status: Home / Self Care | Payer: 59 | Source: Ambulatory Visit | Attending: Internal Medicine | Admitting: Internal Medicine

## 2023-01-26 DIAGNOSIS — Z0000 Encounter for general adult medical examination without abnormal findings: Secondary | ICD-10-CM | POA: Insufficient documentation

## 2023-01-26 DIAGNOSIS — E782 Mixed hyperlipidemia: Secondary | ICD-10-CM | POA: Insufficient documentation

## 2024-05-25 ENCOUNTER — Encounter: Payer: Self-pay | Admitting: Gastroenterology

## 2024-06-09 ENCOUNTER — Ambulatory Visit: Admitting: Anesthesiology

## 2024-06-09 ENCOUNTER — Encounter: Payer: Self-pay | Admitting: Gastroenterology

## 2024-06-09 ENCOUNTER — Ambulatory Visit
Admission: RE | Admit: 2024-06-09 | Discharge: 2024-06-09 | Disposition: A | Attending: Gastroenterology | Admitting: Gastroenterology

## 2024-06-09 ENCOUNTER — Encounter: Admission: RE | Disposition: A | Payer: Self-pay | Source: Home / Self Care | Attending: Gastroenterology

## 2024-06-09 ENCOUNTER — Other Ambulatory Visit: Payer: Self-pay

## 2024-06-09 DIAGNOSIS — M1990 Unspecified osteoarthritis, unspecified site: Secondary | ICD-10-CM | POA: Diagnosis not present

## 2024-06-09 DIAGNOSIS — K5730 Diverticulosis of large intestine without perforation or abscess without bleeding: Secondary | ICD-10-CM | POA: Diagnosis not present

## 2024-06-09 DIAGNOSIS — Z1211 Encounter for screening for malignant neoplasm of colon: Secondary | ICD-10-CM | POA: Diagnosis present

## 2024-06-09 DIAGNOSIS — D128 Benign neoplasm of rectum: Secondary | ICD-10-CM | POA: Diagnosis not present

## 2024-06-09 DIAGNOSIS — Z83719 Family history of colon polyps, unspecified: Secondary | ICD-10-CM | POA: Diagnosis not present

## 2024-06-09 DIAGNOSIS — K219 Gastro-esophageal reflux disease without esophagitis: Secondary | ICD-10-CM | POA: Diagnosis not present

## 2024-06-09 DIAGNOSIS — I10 Essential (primary) hypertension: Secondary | ICD-10-CM | POA: Diagnosis not present

## 2024-06-09 DIAGNOSIS — E119 Type 2 diabetes mellitus without complications: Secondary | ICD-10-CM | POA: Diagnosis not present

## 2024-06-09 DIAGNOSIS — E669 Obesity, unspecified: Secondary | ICD-10-CM | POA: Diagnosis not present

## 2024-06-09 DIAGNOSIS — K640 First degree hemorrhoids: Secondary | ICD-10-CM | POA: Diagnosis not present

## 2024-06-09 DIAGNOSIS — G4733 Obstructive sleep apnea (adult) (pediatric): Secondary | ICD-10-CM | POA: Diagnosis not present

## 2024-06-09 HISTORY — DX: Benign neoplasm, unspecified site: D36.9

## 2024-06-09 HISTORY — PX: HEMOSTASIS CLIP PLACEMENT: SHX6857

## 2024-06-09 HISTORY — DX: Other specified abnormal findings of blood chemistry: R79.89

## 2024-06-09 HISTORY — PX: COLONOSCOPY: SHX5424

## 2024-06-09 HISTORY — DX: Hyperlipidemia, unspecified: E78.5

## 2024-06-09 HISTORY — PX: POLYPECTOMY: SHX149

## 2024-06-09 HISTORY — DX: Type 2 diabetes mellitus without complications: E11.9

## 2024-06-09 LAB — GLUCOSE, CAPILLARY: Glucose-Capillary: 141 mg/dL — ABNORMAL HIGH (ref 70–99)

## 2024-06-09 SURGERY — COLONOSCOPY
Anesthesia: General

## 2024-06-09 MED ORDER — SODIUM CHLORIDE 0.9 % IV SOLN: INTRAVENOUS | Status: DC

## 2024-06-09 MED ORDER — PROPOFOL 500 MG/50ML IV EMUL
INTRAVENOUS | Status: DC | PRN
  Administered 2024-06-09: 175 ug/kg/min via INTRAVENOUS

## 2024-06-09 MED ORDER — PROPOFOL 10 MG/ML IV BOLUS
INTRAVENOUS | Status: DC | PRN
  Administered 2024-06-09: 20 mg via INTRAVENOUS
  Administered 2024-06-09: 80 mg via INTRAVENOUS

## 2024-06-09 MED ORDER — LIDOCAINE HCL (CARDIAC) PF 100 MG/5ML IV SOSY
PREFILLED_SYRINGE | INTRAVENOUS | Status: DC | PRN
  Administered 2024-06-09: 50 mg via INTRAVENOUS

## 2024-06-09 NOTE — Transfer of Care (Signed)
 Immediate Anesthesia Transfer of Care Note  Patient: Micheal Johnson  Procedure(s) Performed: COLONOSCOPY POLYPECTOMY, INTESTINE CONTROL OF HEMORRHAGE, GI TRACT, ENDOSCOPIC, BY CLIPPING OR OVERSEWING  Patient Location: PACU  Anesthesia Type:General  Level of Consciousness: drowsy  Airway & Oxygen Therapy: Patient Spontanous Breathing  Post-op Assessment: Report given to RN and Post -op Vital signs reviewed and stable  Post vital signs: Reviewed and stable  Last Vitals:  Vitals Value Taken Time  BP 109/70 06/09/24 09:17  Temp 35.8 C 06/09/24 09:17  Pulse 65 06/09/24 09:17  Resp 12 06/09/24 09:17  SpO2 96 % 06/09/24 09:17    Last Pain:  Vitals:   06/09/24 0917  TempSrc: Temporal  PainSc:          Complications: No notable events documented.

## 2024-06-09 NOTE — Anesthesia Preprocedure Evaluation (Addendum)
 Anesthesia Evaluation  Patient identified by MRN, date of birth, ID band Patient awake    Reviewed: Allergy & Precautions, NPO status , Patient's Chart, lab work & pertinent test results  History of Anesthesia Complications Negative for: history of anesthetic complications  Airway Mallampati: II   Neck ROM: Full    Dental no notable dental hx.    Pulmonary sleep apnea and Continuous Positive Airway Pressure Ventilation    Pulmonary exam normal breath sounds clear to auscultation       Cardiovascular hypertension, Normal cardiovascular exam Rhythm:Regular Rate:Normal  Echo 02/13/23:  Normal Stress Echocardiogram  NORMAL RIGHT VENTRICULAR SYSTOLIC FUNCTION  TRIVIAL REGURGITATION NOTED   NO VALVULAR STENOSIS NOTED     Neuro/Psych negative neurological ROS     GI/Hepatic ,GERD  ,,  Endo/Other  diabetes, Type 2  Obesity   Renal/GU negative Renal ROS     Musculoskeletal  (+) Arthritis ,    Abdominal   Peds  Hematology negative hematology ROS (+)   Anesthesia Other Findings Cardiology note 03/16/24:  68 year old gentleman with multiple cardiovascular risk factors, exertional dyspnea, abnormal ECG, with CT cardiac scoring 01/26/2023 revealing coronary calcium score 647 at 85th percentile. Rest echocardiogram 02/13/2023 revealed normal left ventricular function, without evidence for scar or ischemia. The patient has essential hypertension, blood pressure well-controlled on current BP medications. The patient has hyperlipidemia, on rosuvastatin.  Plan  1. Continue current medications 2. Counseled patient about low-sodium diet 3. DASH diet printed instructions given to the patient 4. Counseled patient about low-cholesterol diet 5. Continue rosuvastatin for hyperlipidemia management 6. Low-fat and cholesterol diet printed instructions given to the patient 7. Diabetes diet printed instructions given to the patient 8. Return  to clinic for follow-up in 6 months  No orders of the defined types were placed in this encounter.  Return in about 6 months (around 09/13/2024).    Reproductive/Obstetrics                              Anesthesia Physical Anesthesia Plan  ASA: 3  Anesthesia Plan: General   Post-op Pain Management:    Induction: Intravenous  PONV Risk Score and Plan: 2 and Propofol  infusion, TIVA and Treatment may vary due to age or medical condition  Airway Management Planned: Natural Airway  Additional Equipment:   Intra-op Plan:   Post-operative Plan:   Informed Consent: I have reviewed the patients History and Physical, chart, labs and discussed the procedure including the risks, benefits and alternatives for the proposed anesthesia with the patient or authorized representative who has indicated his/her understanding and acceptance.       Plan Discussed with: CRNA  Anesthesia Plan Comments: (LMA/GETA backup discussed.  Patient consented for risks of anesthesia including but not limited to:  - adverse reactions to medications - damage to eyes, teeth, lips or other oral mucosa - nerve damage due to positioning  - sore throat or hoarseness - damage to heart, brain, nerves, lungs, other parts of body or loss of life  Informed patient about role of CRNA in peri- and intra-operative care.  Patient voiced understanding.)         Anesthesia Quick Evaluation

## 2024-06-09 NOTE — Op Note (Signed)
 [facility] Gastroenterology Patient Name: Heraclio Seidman Procedure Date: 06/09/2024 8:34 AM MRN: [id number] Account #: [id number] Date of Birth: Feb 23, 1956 Admit Type: Outpatient Age: 68 Room: [facility] ENDO ROOM 1 Gender: Male Note Status: Finalized Instrument Name: Colon Scope [id number] Procedure:             Colonoscopy Indications:           High risk colon cancer surveillance: Personal history                         of colonic polyps, Family history of colon polyps Providers:             Elspeth Ozell Onita ROSALEA, DO Referring MD:          Oneil PHEBE Pinal, MD (Referring MD) Medicines:             Monitored Anesthesia Care Complications:         No immediate complications. Estimated blood loss:                         Minimal. Procedure:             Pre-Anesthesia Assessment:                        - Prior to the procedure, a History and Physical was                         performed, and patient medications and allergies were                         reviewed. The patient is competent. The risks and                         benefits of the procedure and the sedation options and                         risks were discussed with the patient. All questions                         were answered and informed consent was obtained.                         Patient identification and proposed procedure were                         verified by the physician, the nurse, the anesthetist                         and the technician in the endoscopy suite. Mental                         Status Examination: alert and oriented. Airway                         Examination: normal oropharyngeal airway and neck                         mobility. Respiratory Examination: clear to  auscultation. CV Examination: RRR, no murmurs, no S3                         or S4. Prophylactic Antibiotics: The patient does not                         require prophylactic antibiotics. Prior                          Anticoagulants: The patient has taken no anticoagulant                         or antiplatelet agents. ASA Grade Assessment: III - A                         patient with severe systemic disease. After reviewing                         the risks and benefits, the patient was deemed in                         satisfactory condition to undergo the procedure. The                         anesthesia plan was to use monitored anesthesia care                         (MAC). Immediately prior to administration of                         medications, the patient was re-assessed for adequacy                         to receive sedatives. The heart rate, respiratory                         rate, oxygen saturations, blood pressure, adequacy of                         pulmonary ventilation, and response to care were                         monitored throughout the procedure. The physical                         status of the patient was re-assessed after the                         procedure.                        After obtaining informed consent, the colonoscope was                         passed under direct vision. Throughout the procedure,                         the patient's blood pressure, pulse, and oxygen  saturations were monitored continuously. The                         Colonoscope was introduced through the anus and                         advanced to the the cecum, identified by appendiceal                         orifice and ileocecal valve. The colonoscopy was                         performed without difficulty. The patient tolerated                         the procedure well. The quality of the bowel                         preparation was evaluated using the BBPS [facility] Bowel                         Preparation Scale) with scores of: Right Colon = 3,                         Transverse Colon = 3 and Left Colon = 3 (entire mucosa                          seen well with no residual staining, small fragments                         of stool or opaque liquid). The total BBPS score                         equals 9. The ileocecal valve, appendiceal orifice,                         and rectum were photographed. Findings:      The perianal and digital rectal examinations were normal. Pertinent       negatives include normal sphincter tone.      Retroflexion in the right colon was performed.      Multiple small-mouthed diverticula were found in the entire colon. Most       prevalent in the Left colon.      Non-bleeding internal hemorrhoids were found during retroflexion. The       hemorrhoids were Grade I (internal hemorrhoids that do not prolapse).       Estimated blood loss: none.      A 5 to 6 mm polyp was found in the rectum. The polyp was pedunculated.       The polyp was removed with a hot snare. Resection and retrieval were       complete. Estimated blood loss was minimal. To prevent bleeding after       the polypectomy, one hemostatic clip was successfully placed (MR       conditional). There was no bleeding at the end of the procedure.       Estimated blood loss: none.      The exam was otherwise without abnormality on direct and retroflexion  views. Impression:            - Diverticulosis in the entire examined colon.                        - Non-bleeding internal hemorrhoids.                        - One 5 to 6 mm polyp in the rectum, removed with a                         hot snare. Resected and retrieved. Clip (MR                         conditional) was placed.                        - The examination was otherwise normal on direct and                         retroflexion views. Recommendation:        - Patient has a contact number available for                         emergencies. The signs and symptoms of potential                         delayed complications were discussed with the patient.                          Return to normal activities tomorrow. Written                         discharge instructions were provided to the patient.                        - Discharge patient to home.                        - Resume previous diet.                        - Continue present medications.                        - Await pathology results.                        - No ibuprofen, naproxen, or other non-steroidal                         anti-inflammatory drugs for 5 days after polyp removal.                        - Repeat colonoscopy for surveillance based on                         pathology results.                        - Return to referring physician as previously  scheduled.                        - The findings and recommendations were discussed with                         the patient. Procedure Code(s):     --- Professional ---                        [phone], Colonoscopy, flexible; with removal of                         tumor(s), polyp(s), or other lesion(s) by snare                         technique Diagnosis Code(s):     --- Professional ---                        Z86.010, Personal history of colonic polyps                        K64.0, First degree hemorrhoids                        D12.8, Benign neoplasm of rectum                        K57.30, Diverticulosis of large intestine without                         perforation or abscess without bleeding CPT copyright 2022 American Medical Association. All rights reserved. The codes documented in this report are preliminary and upon coder review may  be revised to meet current compliance requirements. Attending Participation:      I personally performed the entire procedure. Elspeth Jungling, DO Elspeth Ozell Jungling DO, DO 06/09/2024 9:22:44 AM This report has been signed electronically. Number of Addenda: 0 Note Initiated On: 06/09/2024 8:34 AM Scope Withdrawal Time: 0 hours 18 minutes 9 seconds  Total Procedure  Duration: 0 hours 21 minutes 27 seconds  Estimated Blood Loss:  Estimated blood loss was minimal.      [facility]

## 2024-06-09 NOTE — Interval H&P Note (Signed)
 History and Physical Interval Note: Preprocedure H&P from 06/09/2024  was reviewed and there was no interval change after seeing and examining the patient.  Written consent was obtained from the patient after discussion of risks, benefits, and alternatives. Patient has consented to proceed with Colonoscopy with possible intervention   06/09/2024 8:39 AM  Arley LITTIE Kato  has presented today for surgery, with the diagnosis of Personal history of adenomatous and serrated colon polyps (Z86.0101) Family hx colonic polyps (Z83.719).  The various methods of treatment have been discussed with the patient and family. After consideration of risks, benefits and other options for treatment, the patient has consented to  Procedures: COLONOSCOPY (N/A) as a surgical intervention.  The patient's history has been reviewed, patient examined, no change in status, stable for surgery.  I have reviewed the patient's chart and labs.  Questions were answered to the patient's satisfaction.     Elspeth Ozell Jungling

## 2024-06-09 NOTE — H&P (Signed)
 Pre-Procedure H&P   Patient ID: Micheal Johnson is a 68 y.o. male.  Gastroenterology Provider: Elspeth Ozell Jungling, DO  Referring Provider: Dr. Cleotilde PCP: Cleotilde Oneil FALCON, MD  Date: 06/09/2024  HPI Mr. Micheal Johnson is a 68 y.o. male who presents today for Colonoscopy for Personal and family history of colon polyps .  Last underwent colonoscopy in October 2020 with tubular adenoma.  Hyperplastic polyps on colonoscopy 2009 Left-sided diverticulosis and internal hemorrhoids  Regular bowel meds without melena or hematochezia Cleared for procedure and sedation by cardiology  Mother with colon polyps   Past Medical History:  Diagnosis Date   Arthritis    hands   Diabetes mellitus without complication (HCC)    Diverticulitis    /OSIS   GERD (gastroesophageal reflux disease)    Hyperlipidemia    Hypertension    Low serum vitamin D    OSA on CPAP    C-PAP   Tubular adenoma     Past Surgical History:  Procedure Laterality Date   BROW LIFT Bilateral 10/28/2016   Procedure: BLEPHAROPLASTY upper eyelid with excess skin;  Surgeon: Greig CHRISTELLA Gay, MD;  Location: [facility];  Service: Ophthalmology;  Laterality: Bilateral;  SLEEP APNEA-CPAP/ Anesthesia:  Mac   CIRCUMCISION N/A 03/10/2014   Procedure: CIRCUMCISION ADULT;  Surgeon: Morene LELON Salines, MD;  Location: [facility];  Service: Urology;  Laterality: N/A;   COLONOSCOPY     COLONOSCOPY WITH PROPOFOL  N/A 04/11/2019   Procedure: COLONOSCOPY WITH PROPOFOL ;  Surgeon: Gaylyn Gladis PENNER, MD;  Location: [facility] ENDOSCOPY;  Service: Endoscopy;  Laterality: N/A;  [facility] HAS HARD COPY OF POSITIVE FROM SEPTEMBER; ATTACHED TO CHART   HERNIA REPAIR     UMBILICAL HERNIA   UMBILICAL HERNIA REPAIR  12-02-2013   VASECTOMY      Family History Mother- colon polyps No other h/o GI disease or malignancy  Review of Systems  Constitutional:  Negative for activity change, appetite change, chills, diaphoresis, fatigue, fever  and unexpected weight change.  HENT:  Negative for trouble swallowing and voice change.   Respiratory:  Negative for shortness of breath and wheezing.   Cardiovascular:  Negative for chest pain, palpitations and leg swelling.  Gastrointestinal:  Negative for abdominal distention, abdominal pain, anal bleeding, blood in stool, constipation, diarrhea, nausea and vomiting.  Musculoskeletal:  Negative for arthralgias and myalgias.  Skin:  Negative for color change and pallor.  Neurological:  Negative for dizziness, syncope and weakness.  Psychiatric/Behavioral:  Negative for confusion. The patient is not nervous/anxious.   All other systems reviewed and are negative.    Medications Medications Ordered Prior to Encounter[1]  Pertinent medications related to GI and procedure were reviewed by me with the patient prior to the procedure  Current Medications[2]  sodium chloride          Allergies[3] Allergies were reviewed by me prior to the procedure  Objective   Body mass index is 34.55 kg/m. Vitals:   06/09/24 0826  BP: (!) 160/102  Pulse: 87  Resp: 20  Temp: (!) 96.9 F (36.1 C)  TempSrc: Temporal  SpO2: 98%  Weight: 103.1 kg  Height: 5' 8 (1.727 m)     Physical Exam Vitals and nursing note reviewed.  Constitutional:      General: He is not in acute distress.    Appearance: Normal appearance. He is not ill-appearing, toxic-appearing or diaphoretic.  HENT:     Head: Normocephalic and atraumatic.     Nose: Nose  normal.     Mouth/Throat:     Mouth: Mucous membranes are moist.     Pharynx: Oropharynx is clear.  Eyes:     General: No scleral icterus.    Extraocular Movements: Extraocular movements intact.  Cardiovascular:     Rate and Rhythm: Normal rate and regular rhythm.     Heart sounds: Normal heart sounds. No murmur heard.    No friction rub. No gallop.  Pulmonary:     Effort: Pulmonary effort is normal. No respiratory distress.     Breath sounds: Normal  breath sounds. No wheezing, rhonchi or rales.  Abdominal:     General: Bowel sounds are normal. There is no distension.     Palpations: Abdomen is soft.     Tenderness: There is no abdominal tenderness. There is no guarding or rebound.  Musculoskeletal:     Cervical back: Neck supple.     Right lower leg: No edema.     Left lower leg: No edema.  Skin:    General: Skin is warm and dry.     Coloration: Skin is not jaundiced or pale.  Neurological:     General: No focal deficit present.     Mental Status: He is alert and oriented to person, place, and time. Mental status is at baseline.  Psychiatric:        Mood and Affect: Mood normal.        Behavior: Behavior normal.        Thought Content: Thought content normal.        Judgment: Judgment normal.      Assessment:  Mr. Micheal Johnson is a 68 y.o. male  who presents today for Colonoscopy for Personal and family history of colon polyps .  Plan:  Colonoscopy with possible intervention today  Colonoscopy with possible biopsy, control of bleeding, polypectomy, and interventions as necessary has been discussed with the patient/patient representative. Informed consent was obtained from the patient/patient representative after explaining the indication, nature, and risks of the procedure including but not limited to death, bleeding, perforation, missed neoplasm/lesions, cardiorespiratory compromise, and reaction to medications. Opportunity for questions was given and appropriate answers were provided. Patient/patient representative has verbalized understanding is amenable to undergoing the procedure.   Elspeth Ozell Jungling, DO  [facility] Gastroenterology  Portions of the record may have been created with voice recognition software. Occasional wrong-word or 'sound-a-like' substitutions may have occurred due to the inherent limitations of voice recognition software.  Read the chart carefully and recognize, using context, where  substitutions may have occurred.     [1]  No current facility-administered medications on file prior to encounter.   Current Outpatient Medications on File Prior to Encounter  Medication Sig Dispense Refill   aspirin EC 81 MG tablet Take 81 mg by mouth daily.     losartan (COZAAR) 100 MG tablet Take 100 mg by mouth every morning.     metFORMIN (GLUCOPHAGE) 500 MG tablet Take 500 mg by mouth 2 (two) times daily.     omeprazole (PRILOSEC) 20 MG capsule Take 20 mg by mouth every morning.     rosuvastatin (CRESTOR) 5 MG tablet Take 5 mg by mouth daily.     albuterol  (VENTOLIN  HFA) 108 (90 Base) MCG/ACT inhaler Inhale 1-2 puffs into the lungs every 6 (six) hours as needed. 18 g 0   benzonatate  (TESSALON ) 200 MG capsule Take 1 capsule (200 mg total) by mouth 3 (three) times daily as needed for cough. 30 capsule  0   hydrochlorothiazide (HYDRODIURIL) 25 MG tablet Take 25 mg by mouth daily.     Nutritional Supplements (JUICE PLUS FIBRE PO) Take 1 capsule by mouth daily. JUICE PLUS-VEGETABLE BLEND,BERRY BLEND,OMEGA BLEND     predniSONE  (STERAPRED UNI-PAK 21 TAB) 10 MG (21) TBPK tablet Take by mouth daily. As directed 21 tablet 0  [2]  Current Facility-Administered Medications:    0.9 %  sodium chloride  infusion, , Intravenous, Continuous, Onita Elspeth Sharper, DO [3] No Known Allergies

## 2024-06-09 NOTE — Anesthesia Postprocedure Evaluation (Signed)
 Anesthesia Post Note  Patient: Micheal Johnson  Procedure(s) Performed: COLONOSCOPY POLYPECTOMY, INTESTINE CONTROL OF HEMORRHAGE, GI TRACT, ENDOSCOPIC, BY CLIPPING OR OVERSEWING  Patient location during evaluation: PACU Anesthesia Type: General Level of consciousness: awake and alert, oriented and patient cooperative Pain management: pain level controlled Vital Signs Assessment: post-procedure vital signs reviewed and stable Respiratory status: spontaneous breathing, nonlabored ventilation and respiratory function stable Cardiovascular status: blood pressure returned to baseline and stable Postop Assessment: adequate PO intake Anesthetic complications: no   No notable events documented.   Last Vitals:  Vitals:   06/09/24 0937 06/09/24 0947  BP:  (!) 125/93  Pulse:    Resp: 20 16  Temp:    SpO2: 97% 98%    Last Pain:  Vitals:   06/09/24 0947  TempSrc:   PainSc: 0-No pain                 Alfonso Ruths

## 2024-06-10 LAB — SURGICAL PATHOLOGY
